# Patient Record
Sex: Female | Born: 1960 | Race: Black or African American | Hispanic: No | Marital: Single | Smoking: Never smoker
Health system: Southern US, Community
[De-identification: ages and names within clinical notes are randomized; demographics above are authoritative.]

## PROBLEM LIST (undated history)

## (undated) DIAGNOSIS — I10 Essential (primary) hypertension: Secondary | ICD-10-CM

## (undated) DIAGNOSIS — I639 Cerebral infarction, unspecified: Secondary | ICD-10-CM

## (undated) DIAGNOSIS — E119 Type 2 diabetes mellitus without complications: Secondary | ICD-10-CM

## (undated) DIAGNOSIS — G9340 Encephalopathy, unspecified: Secondary | ICD-10-CM

## (undated) DIAGNOSIS — R569 Unspecified convulsions: Secondary | ICD-10-CM

## (undated) HISTORY — DX: Essential (primary) hypertension: I10

## (undated) HISTORY — DX: Unspecified convulsions: R56.9

## (undated) HISTORY — DX: Cerebral infarction, unspecified: I63.9

---

## 2013-07-19 ENCOUNTER — Encounter: Payer: Self-pay | Admitting: Emergency Medicine

## 2013-07-19 ENCOUNTER — Ambulatory Visit (INDEPENDENT_AMBULATORY_CARE_PROVIDER_SITE_OTHER): Payer: Federal, State, Local not specified - PPO | Admitting: Emergency Medicine

## 2013-07-19 VITALS — BP 210/122 | HR 90 | Temp 98.7°F | Resp 16 | Ht 63.0 in | Wt 143.0 lb

## 2013-07-19 DIAGNOSIS — S335XXA Sprain of ligaments of lumbar spine, initial encounter: Secondary | ICD-10-CM

## 2013-07-19 MED ORDER — TRAMADOL-ACETAMINOPHEN 37.5-325 MG PO TABS: 1.0000 | ORAL_TABLET | Freq: Four times a day (QID) | ORAL | Status: DC | PRN

## 2013-07-19 MED ORDER — CYCLOBENZAPRINE HCL 10 MG PO TABS: 10.0000 mg | ORAL_TABLET | Freq: Three times a day (TID) | ORAL | Status: DC | PRN

## 2013-07-19 MED ORDER — NAPROXEN SODIUM 550 MG PO TABS: 550.0000 mg | ORAL_TABLET | Freq: Two times a day (BID) | ORAL | Status: AC

## 2013-07-19 NOTE — Progress Notes (Signed)
[facility] [address], [place] [address] [phone]  Date:  07/19/2013   Name:  Rubena Roseman   DOB:  Dec 12, 1960   MRN:  [id number]  PCP:  No primary provider on file.    Chief Complaint: Back Pain   History of Present Illness:  Jaeliana Lococo is a 52 y.o. very pleasant female patient who presents with the following:  Moving from here to [facility] and packing.  Has pain in the low back.  Non radiating and no neuro symptoms.  No history of injury.  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  Attributes pain in back to lifting and bending.  No improvement with over the counter medications or other home remedies. Denies other complaint or health concern today.   There are no active problems to display for this patient.   Past Medical History  Diagnosis Date  . Hypertension     No past surgical history on file.  History  Substance Use Topics  . Smoking status: Never Smoker   . Smokeless tobacco: Not on file  . Alcohol Use: No    Family History  Problem Relation Age of Onset  . Cancer Mother   . Hypertension Mother   . Hypertension Father   . Hypertension Brother   . Hypertension Maternal Grandmother   . Stroke Maternal Grandfather     No Known Allergies  Medication list has been reviewed and updated.  No current outpatient prescriptions on file prior to visit.   No current facility-administered medications on file prior to visit.    Review of Systems:  As per HPI, otherwise negative.    Physical Examination: Filed Vitals:   07/19/13 2047  BP: 210/122  Pulse: 90  Temp: 98.7 F (37.1 C)  Resp: 16   Filed Vitals:   07/19/13 2047  Height: 5\' 3"  (1.6 m)  Weight: 143 lb (64.864 kg)   Body mass index is 25.34 kg/(m^2). Ideal Body Weight: Weight in (lb) to have BMI = 25: 140.8   GEN: WDWN, NAD, Non-toxic, Alert & Oriented x 3 HEENT: Atraumatic, Normocephalic.  Ears and Nose: No external deformity. EXTR: No  clubbing/cyanosis/edema NEURO: Normal gait.  PSYCH: Normally interactive. Conversant. Not depressed or anxious appearing.  Calm demeanor.  BACK:  Tender lumbar paraspinous muscles.  Assessment and Plan: Lumbosacral strain Anaprox Flexeril Ultram   Signed,  Phillips Odor, MD

## 2013-07-19 NOTE — Patient Instructions (Signed)

## 2015-07-10 ENCOUNTER — Emergency Department (HOSPITAL_COMMUNITY): Payer: Federal, State, Local not specified - PPO

## 2015-07-10 ENCOUNTER — Inpatient Hospital Stay (HOSPITAL_COMMUNITY)
Admission: EM | Admit: 2015-07-10 | Discharge: 2015-07-13 | DRG: 064 | Disposition: A | Discharge status: Home Health | Payer: Federal, State, Local not specified - PPO | Attending: Internal Medicine | Admitting: Internal Medicine

## 2015-07-10 ENCOUNTER — Encounter (HOSPITAL_COMMUNITY): Payer: Self-pay | Admitting: Emergency Medicine

## 2015-07-10 DIAGNOSIS — I160 Hypertensive urgency: Secondary | ICD-10-CM | POA: Diagnosis present

## 2015-07-10 DIAGNOSIS — Z823 Family history of stroke: Secondary | ICD-10-CM

## 2015-07-10 DIAGNOSIS — Z9114 Patient's other noncompliance with medication regimen: Secondary | ICD-10-CM | POA: Diagnosis not present

## 2015-07-10 DIAGNOSIS — G9340 Encephalopathy, unspecified: Secondary | ICD-10-CM

## 2015-07-10 DIAGNOSIS — D72829 Elevated white blood cell count, unspecified: Secondary | ICD-10-CM | POA: Diagnosis present

## 2015-07-10 DIAGNOSIS — J9622 Acute and chronic respiratory failure with hypercapnia: Secondary | ICD-10-CM | POA: Diagnosis present

## 2015-07-10 DIAGNOSIS — E1101 Type 2 diabetes mellitus with hyperosmolarity with coma: Secondary | ICD-10-CM | POA: Insufficient documentation

## 2015-07-10 DIAGNOSIS — Z8249 Family history of ischemic heart disease and other diseases of the circulatory system: Secondary | ICD-10-CM | POA: Diagnosis not present

## 2015-07-10 DIAGNOSIS — H5500 Unspecified nystagmus: Secondary | ICD-10-CM | POA: Diagnosis present

## 2015-07-10 DIAGNOSIS — R531 Weakness: Secondary | ICD-10-CM | POA: Diagnosis present

## 2015-07-10 DIAGNOSIS — N179 Acute kidney failure, unspecified: Secondary | ICD-10-CM | POA: Diagnosis present

## 2015-07-10 DIAGNOSIS — I6789 Other cerebrovascular disease: Secondary | ICD-10-CM | POA: Diagnosis not present

## 2015-07-10 DIAGNOSIS — R569 Unspecified convulsions: Secondary | ICD-10-CM | POA: Diagnosis not present

## 2015-07-10 DIAGNOSIS — E785 Hyperlipidemia, unspecified: Secondary | ICD-10-CM | POA: Diagnosis present

## 2015-07-10 DIAGNOSIS — Z794 Long term (current) use of insulin: Secondary | ICD-10-CM | POA: Diagnosis not present

## 2015-07-10 DIAGNOSIS — E871 Hypo-osmolality and hyponatremia: Secondary | ICD-10-CM | POA: Diagnosis present

## 2015-07-10 DIAGNOSIS — I161 Hypertensive emergency: Secondary | ICD-10-CM | POA: Insufficient documentation

## 2015-07-10 DIAGNOSIS — Z79899 Other long term (current) drug therapy: Secondary | ICD-10-CM | POA: Diagnosis not present

## 2015-07-10 DIAGNOSIS — R410 Disorientation, unspecified: Secondary | ICD-10-CM | POA: Insufficient documentation

## 2015-07-10 DIAGNOSIS — G9341 Metabolic encephalopathy: Secondary | ICD-10-CM | POA: Diagnosis present

## 2015-07-10 DIAGNOSIS — E876 Hypokalemia: Secondary | ICD-10-CM | POA: Diagnosis present

## 2015-07-10 DIAGNOSIS — I639 Cerebral infarction, unspecified: Principal | ICD-10-CM | POA: Diagnosis present

## 2015-07-10 DIAGNOSIS — E1165 Type 2 diabetes mellitus with hyperglycemia: Secondary | ICD-10-CM | POA: Diagnosis present

## 2015-07-10 DIAGNOSIS — R739 Hyperglycemia, unspecified: Secondary | ICD-10-CM | POA: Diagnosis present

## 2015-07-10 DIAGNOSIS — R29810 Facial weakness: Secondary | ICD-10-CM | POA: Diagnosis present

## 2015-07-10 DIAGNOSIS — G459 Transient cerebral ischemic attack, unspecified: Secondary | ICD-10-CM | POA: Insufficient documentation

## 2015-07-10 DIAGNOSIS — D649 Anemia, unspecified: Secondary | ICD-10-CM | POA: Diagnosis present

## 2015-07-10 DIAGNOSIS — E870 Hyperosmolality and hypernatremia: Secondary | ICD-10-CM | POA: Diagnosis present

## 2015-07-10 DIAGNOSIS — I10 Essential (primary) hypertension: Secondary | ICD-10-CM | POA: Insufficient documentation

## 2015-07-10 DIAGNOSIS — E860 Dehydration: Secondary | ICD-10-CM | POA: Diagnosis present

## 2015-07-10 DIAGNOSIS — E872 Acidosis, unspecified: Secondary | ICD-10-CM | POA: Insufficient documentation

## 2015-07-10 HISTORY — DX: Type 2 diabetes mellitus without complications: E11.9

## 2015-07-10 HISTORY — DX: Encephalopathy, unspecified: G93.40

## 2015-07-10 LAB — CBC
HCT: 37 % (ref 36.0–46.0)
HCT: 35.2 % — ABNORMAL LOW (ref 36.0–46.0)
HEMOGLOBIN: 12.3 g/dL (ref 12.0–15.0)
HEMOGLOBIN: 11.5 g/dL — AB (ref 12.0–15.0)
MCH: 29.2 pg (ref 26.0–34.0)
MCH: 28.8 pg (ref 26.0–34.0)
MCHC: 33.2 g/dL (ref 30.0–36.0)
MCHC: 32.7 g/dL (ref 30.0–36.0)
MCV: 87.9 fL (ref 78.0–100.0)
MCV: 88.2 fL (ref 78.0–100.0)
Platelets: 287 10*3/uL (ref 150–400)
Platelets: 264 10*3/uL (ref 150–400)
RBC: 4.21 MIL/uL (ref 3.87–5.11)
RBC: 3.99 MIL/uL (ref 3.87–5.11)
RDW: 13.4 % (ref 11.5–15.5)
RDW: 13.2 % (ref 11.5–15.5)
WBC: 10.8 10*3/uL — ABNORMAL HIGH (ref 4.0–10.5)
WBC: 14.1 10*3/uL — ABNORMAL HIGH (ref 4.0–10.5)

## 2015-07-10 LAB — BLOOD GAS, ARTERIAL
ACID-BASE EXCESS: 1.5 mmol/L (ref 0.0–2.0)
Acid-Base Excess: 0.2 mmol/L (ref 0.0–2.0)
BICARBONATE: 27.2 meq/L — AB (ref 20.0–24.0)
Bicarbonate: 26.3 mEq/L — ABNORMAL HIGH (ref 20.0–24.0)
DRAWN BY: 252031
O2 Content: 3 L/min
O2 SAT: 95.7 %
O2 Saturation: 98.8 %
PATIENT TEMPERATURE: 98.6
PCO2 ART: 69.9 mmHg — AB (ref 35.0–45.0)
PH ART: 7.215 — AB (ref 7.350–7.450)
PH ART: 7.359 (ref 7.350–7.450)
Patient temperature: 98.6
TCO2: 29.3 mmol/L (ref 0–100)
TCO2: 27.8 mmol/L (ref 0–100)
pCO2 arterial: 47.9 mmHg — ABNORMAL HIGH (ref 35.0–45.0)
pO2, Arterial: 184 mmHg — ABNORMAL HIGH (ref 80.0–100.0)
pO2, Arterial: 80.2 mmHg (ref 80.0–100.0)

## 2015-07-10 LAB — ETHANOL: Alcohol, Ethyl (B): <5 mg/dL (ref <5)

## 2015-07-10 LAB — COMPREHENSIVE METABOLIC PANEL
ALBUMIN: 3.8 g/dL (ref 3.5–5.0)
ALT: 11 U/L — ABNORMAL LOW (ref 14–54)
ANION GAP: 13 (ref 5–15)
AST: 22 U/L (ref 15–41)
Alkaline Phosphatase: 97 U/L (ref 38–126)
BILIRUBIN TOTAL: 0.9 mg/dL (ref 0.3–1.2)
BUN: 14 mg/dL (ref 6–20)
CHLORIDE: 95 mmol/L — AB (ref 101–111)
CO2: 24 mmol/L (ref 22–32)
Calcium: 9 mg/dL (ref 8.9–10.3)
Creatinine, Ser: 1.41 mg/dL — ABNORMAL HIGH (ref 0.44–1.00)
GFR calc Af Amer: 48 mL/min — ABNORMAL LOW (ref >60)
GFR calc non Af Amer: 41 mL/min — ABNORMAL LOW (ref >60)
GLUCOSE: 652 mg/dL — AB (ref 65–99)
POTASSIUM: 4.4 mmol/L (ref 3.5–5.1)
SODIUM: 132 mmol/L — AB (ref 135–145)
Total Protein: 7.8 g/dL (ref 6.5–8.1)

## 2015-07-10 LAB — URINE MICROSCOPIC-ADD ON

## 2015-07-10 LAB — I-STAT ARTERIAL BLOOD GAS, ED
Acid-base deficit: 2 mmol/L (ref 0.0–2.0)
Bicarbonate: 27.6 meq/L — ABNORMAL HIGH (ref 20.0–24.0)
O2 Saturation: 78 %
TCO2: 30 mmol/L (ref 0–100)
pCO2 arterial: 69.2 mmHg (ref 35.0–45.0)
pH, Arterial: 7.209 — ABNORMAL LOW (ref 7.350–7.450)
pO2, Arterial: 53 mmHg — ABNORMAL LOW (ref 80.0–100.0)

## 2015-07-10 LAB — I-STAT VENOUS BLOOD GAS, ED
ACID-BASE EXCESS: 2 mmol/L (ref 0.0–2.0)
Bicarbonate: 31.6 mEq/L — ABNORMAL HIGH (ref 20.0–24.0)
O2 SAT: 33 %
PCO2 VEN: 71.1 mmHg — AB (ref 45.0–50.0)
PO2 VEN: 24 mmHg — AB (ref 30.0–45.0)
TCO2: 34 mmol/L (ref 0–100)
pH, Ven: 7.256 (ref 7.250–7.300)

## 2015-07-10 LAB — URINALYSIS, ROUTINE W REFLEX MICROSCOPIC
Bilirubin Urine: NEGATIVE
KETONES UR: NEGATIVE mg/dL
Nitrite: NEGATIVE
PH: 7 (ref 5.0–8.0)
PROTEIN: NEGATIVE mg/dL
Specific Gravity, Urine: 1.023 (ref 1.005–1.030)

## 2015-07-10 LAB — MRSA PCR SCREENING: MRSA BY PCR: NEGATIVE

## 2015-07-10 LAB — I-STAT CHEM 8, ED
BUN: 19 mg/dL (ref 6–20)
CALCIUM ION: 1.11 mmol/L — AB (ref 1.12–1.23)
CREATININE: 1.2 mg/dL — AB (ref 0.44–1.00)
Chloride: 94 mmol/L — ABNORMAL LOW (ref 101–111)
Glucose, Bld: 612 mg/dL (ref 65–99)
HCT: 40 % (ref 36.0–46.0)
Hemoglobin: 13.6 g/dL (ref 12.0–15.0)
Potassium: 4 mmol/L (ref 3.5–5.1)
Sodium: 133 mmol/L — ABNORMAL LOW (ref 135–145)
TCO2: 31 mmol/L (ref 0–100)

## 2015-07-10 LAB — GLUCOSE, CAPILLARY
GLUCOSE-CAPILLARY: 322 mg/dL — AB (ref 65–99)
GLUCOSE-CAPILLARY: 316 mg/dL — AB (ref 65–99)
Glucose-Capillary: 403 mg/dL — ABNORMAL HIGH (ref 65–99)
Glucose-Capillary: 228 mg/dL — ABNORMAL HIGH (ref 65–99)

## 2015-07-10 LAB — APTT: aPTT: 29 seconds (ref 24–37)

## 2015-07-10 LAB — PROTIME-INR
INR: 1.01 (ref 0.00–1.49)
Prothrombin Time: 13.5 seconds (ref 11.6–15.2)

## 2015-07-10 LAB — BASIC METABOLIC PANEL
ANION GAP: 11 (ref 5–15)
BUN: 11 mg/dL (ref 6–20)
CALCIUM: 8.7 mg/dL — AB (ref 8.9–10.3)
CO2: 26 mmol/L (ref 22–32)
Chloride: 102 mmol/L (ref 101–111)
Creatinine, Ser: 0.89 mg/dL (ref 0.44–1.00)
GFR calc Af Amer: >60 mL/min (ref >60)
GLUCOSE: 343 mg/dL — AB (ref 65–99)
Potassium: 3.6 mmol/L (ref 3.5–5.1)
Sodium: 139 mmol/L (ref 135–145)

## 2015-07-10 LAB — DIFFERENTIAL
Basophils Absolute: 0 10*3/uL (ref 0.0–0.1)
Basophils Relative: 0 %
EOS PCT: 1 %
Eosinophils Absolute: 0.1 10*3/uL (ref 0.0–0.7)
LYMPHS ABS: 1.6 10*3/uL (ref 0.7–4.0)
LYMPHS PCT: 15 %
Monocytes Absolute: 0.6 10*3/uL (ref 0.1–1.0)
Monocytes Relative: 6 %
NEUTROS PCT: 78 %
Neutro Abs: 8.4 10*3/uL — ABNORMAL HIGH (ref 1.7–7.7)

## 2015-07-10 LAB — POCT I-STAT 3, ART BLOOD GAS (G3+)
Acid-base deficit: 1 mmol/L (ref 0.0–2.0)
BICARBONATE: 28 meq/L — AB (ref 20.0–24.0)
O2 Saturation: 86 %
PH ART: 7.242 — AB (ref 7.350–7.450)
TCO2: 30 mmol/L (ref 0–100)
pCO2 arterial: 65 mmHg (ref 35.0–45.0)
pO2, Arterial: 62 mmHg — ABNORMAL LOW (ref 80.0–100.0)

## 2015-07-10 LAB — LIPID PANEL
CHOL/HDL RATIO: 3.7 ratio
Cholesterol: 323 mg/dL — ABNORMAL HIGH (ref 0–200)
HDL: 88 mg/dL (ref >40)
LDL Cholesterol: 203 mg/dL — ABNORMAL HIGH (ref 0–99)
Triglycerides: 162 mg/dL — ABNORMAL HIGH (ref <150)
VLDL: 32 mg/dL (ref 0–40)

## 2015-07-10 LAB — LACTIC ACID, PLASMA
LACTIC ACID, VENOUS: 1.9 mmol/L (ref 0.5–2.0)
LACTIC ACID, VENOUS: 2.3 mmol/L — AB (ref 0.5–2.0)

## 2015-07-10 LAB — RAPID URINE DRUG SCREEN, HOSP PERFORMED
AMPHETAMINES: NOT DETECTED
BARBITURATES: NOT DETECTED
BENZODIAZEPINES: NOT DETECTED
COCAINE: NOT DETECTED
OPIATES: NOT DETECTED
TETRAHYDROCANNABINOL: NOT DETECTED

## 2015-07-10 LAB — I-STAT TROPONIN, ED: Troponin i, poc: 0 ng/mL (ref 0.00–0.08)

## 2015-07-10 LAB — CBG MONITORING, ED: Glucose-Capillary: >600 mg/dL (ref 65–99)

## 2015-07-10 LAB — TROPONIN I: TROPONIN I: 0.04 ng/mL — AB (ref <0.031)

## 2015-07-10 LAB — AMMONIA: Ammonia: 40 umol/L — ABNORMAL HIGH (ref 9–35)

## 2015-07-10 MED ORDER — SODIUM CHLORIDE 0.9 % IV SOLN
INTRAVENOUS | Status: AC
  Administered 2015-07-10: 1000 mL via INTRAVENOUS

## 2015-07-10 MED ORDER — SODIUM CHLORIDE 0.9 % IV SOLN
INTRAVENOUS | Status: DC
  Administered 2015-07-10: 3.4 [IU]/h via INTRAVENOUS
  Filled 2015-07-10: qty 2.5

## 2015-07-10 MED ORDER — SODIUM CHLORIDE 0.9 % IV BOLUS (SEPSIS)
1000.0000 mL | Freq: Once | INTRAVENOUS | Status: AC
  Administered 2015-07-10: 1000 mL via INTRAVENOUS

## 2015-07-10 MED ORDER — HYDRALAZINE HCL 20 MG/ML IJ SOLN
5.0000 mg | INTRAMUSCULAR | Status: DC | PRN
  Filled 2015-07-10: qty 1

## 2015-07-10 MED ORDER — DEXTROSE-NACL 5-0.45 % IV SOLN
INTRAVENOUS | Status: DC
  Administered 2015-07-11: 04:00:00 via INTRAVENOUS

## 2015-07-10 MED ORDER — HYDRALAZINE HCL 20 MG/ML IJ SOLN
10.0000 mg | INTRAMUSCULAR | Status: DC | PRN
  Administered 2015-07-12 – 2015-07-13 (×3): 20 mg via INTRAVENOUS
  Filled 2015-07-10 (×3): qty 1

## 2015-07-10 MED ORDER — HYDRALAZINE HCL 20 MG/ML IJ SOLN: 10.0000 mg | INTRAMUSCULAR | Status: DC | PRN

## 2015-07-10 MED ORDER — VANCOMYCIN HCL 500 MG IV SOLR
500.0000 mg | Freq: Two times a day (BID) | INTRAVENOUS | Status: DC
  Administered 2015-07-11 – 2015-07-12 (×3): 500 mg via INTRAVENOUS
  Filled 2015-07-10 (×4): qty 500

## 2015-07-10 MED ORDER — HYDRALAZINE HCL 20 MG/ML IJ SOLN: 10.0000 mg | Freq: Once | INTRAMUSCULAR | Status: DC

## 2015-07-10 MED ORDER — PIPERACILLIN-TAZOBACTAM 3.375 G IVPB
3.3750 g | Freq: Three times a day (TID) | INTRAVENOUS | Status: DC
  Administered 2015-07-11 – 2015-07-12 (×4): 3.375 g via INTRAVENOUS
  Filled 2015-07-10 (×6): qty 50

## 2015-07-10 MED ORDER — HYDRALAZINE HCL 20 MG/ML IJ SOLN: 5.0000 mg | INTRAMUSCULAR | Status: DC | PRN

## 2015-07-10 MED ORDER — LABETALOL HCL 5 MG/ML IV SOLN
10.0000 mg | Freq: Once | INTRAVENOUS | Status: AC
  Administered 2015-07-10: 10 mg via INTRAVENOUS
  Filled 2015-07-10: qty 4

## 2015-07-10 MED ORDER — PIPERACILLIN-TAZOBACTAM 3.375 G IVPB 30 MIN
3.3750 g | Freq: Once | INTRAVENOUS | Status: AC
  Administered 2015-07-10: 3.375 g via INTRAVENOUS
  Filled 2015-07-10: qty 50

## 2015-07-10 MED ORDER — STROKE: EARLY STAGES OF RECOVERY BOOK
Freq: Once | Status: AC
  Administered 2015-07-10: 22:00:00
  Filled 2015-07-10: qty 1

## 2015-07-10 MED ORDER — SENNOSIDES-DOCUSATE SODIUM 8.6-50 MG PO TABS: 1.0000 | ORAL_TABLET | Freq: Every evening | ORAL | Status: DC | PRN

## 2015-07-10 MED ORDER — VANCOMYCIN HCL IN DEXTROSE 1-5 GM/200ML-% IV SOLN
1000.0000 mg | Freq: Once | INTRAVENOUS | Status: AC
  Administered 2015-07-10: 1000 mg via INTRAVENOUS
  Filled 2015-07-10: qty 200

## 2015-07-10 MED ORDER — HYDRALAZINE HCL 20 MG/ML IJ SOLN
5.0000 mg | Freq: Once | INTRAMUSCULAR | Status: DC
  Administered 2015-07-10: 5 mg via INTRAVENOUS

## 2015-07-10 MED ORDER — ONDANSETRON HCL 4 MG/2ML IJ SOLN: 4.0000 mg | Freq: Four times a day (QID) | INTRAMUSCULAR | Status: DC | PRN

## 2015-07-10 MED ORDER — ENOXAPARIN SODIUM 30 MG/0.3ML ~~LOC~~ SOLN
30.0000 mg | SUBCUTANEOUS | Status: DC
  Administered 2015-07-10: 30 mg via SUBCUTANEOUS
  Filled 2015-07-10: qty 0.3

## 2015-07-10 NOTE — Progress Notes (Signed)
CRITICAL VALUE ALERT  Critical value received:  Lactic acid 2.3   Date of notification:  07/10/15  Time of notification:  2200   Critical value read back:Yes.    Nurse who received alert:  Santiago BurElizabeth Lyzbeth Genrich, RN  MD notified (1st page):  Maren ReamerKaren Kirby, NP  Time of first page:  2225  MD notified (2nd page):  Time of second page:  Responding MD:  Maren ReamerKaren Kirby, NP  Time MD responded:  2225

## 2015-07-10 NOTE — ED Notes (Signed)
Emergency Contact notified of pts admission to the hospital. States she is on her way. Directions given.

## 2015-07-10 NOTE — ED Provider Notes (Addendum)
CSN: [id number]     Arrival date & time 07/10/15  1419 History   First MD Initiated Contact with Patient 07/10/15 1419     Chief Complaint  Patient presents with  . Altered Mental Status     (Consider location/radiation/quality/duration/timing/severity/associated sxs/prior Treatment) HPI  54 year old female presents brought in by EMS with altered mental status. History is taken from EMS as the patient is currently confused. Per them, [facility] because patient was not acting right and sitting [facility] the curb at [employer]. Apparently they have found her car and there is a TV box next to it. It is uncertain [facility] or how she got there and if she drove. Patient is answering nonsensically and cannot provide any information. It is unknown how long she has been altered. Patient is not moving the left side of her body per EMS and seems to have a left-sided facial droop. She has been hypertensive, over 200/100. He also noted her glucose to be over 600. Patient knows her name but does not answer other questions very well.  Past Medical History  Diagnosis Date  . Hypertension   . Diabetes mellitus without complication (HCC)    History reviewed. No pertinent past surgical history. Family History  Problem Relation Age of Onset  . Cancer Mother   . Hypertension Mother   . Hypertension Father   . Hypertension Brother   . Hypertension Maternal Grandmother   . Stroke Maternal Grandfather    Social History  Substance Use Topics  . Smoking status: Never Smoker   . Smokeless tobacco: None  . Alcohol Use: No   OB History    No data available     Review of Systems  Unable to perform ROS: Mental status change      Allergies  Review of patient's allergies indicates no known allergies.  Home Medications   Prior to Admission medications   Medication Sig Start Date End Date Taking? Authorizing Provider  amLODipine (NORVASC) 5 MG tablet Take 5 mg by  mouth daily.   Yes Historical Provider, MD  insulin detemir (LEVEMIR) 100 UNIT/ML injection Inject 10 Units into the skin every evening. 06/23/15 06/22/16 Yes Historical Provider, MD  metFORMIN (GLUCOPHAGE) 500 MG tablet Take 500 mg by mouth 2 (two) times daily.   Yes Historical Provider, MD  metoprolol tartrate (LOPRESSOR) 25 MG tablet Take 6.25 mg by mouth 2 (two) times daily. 06/23/15  Yes Historical Provider, MD   BP 214/121 mmHg  Pulse 109  Temp(Src) 98 F (36.7 C) (Oral)  Resp 20  Ht 5\' 1"  (1.549 m)  SpO2 98% Physical Exam  Constitutional: She appears well-developed and well-nourished.  HENT:  Head: Normocephalic and atraumatic.  Right Ear: External ear normal.  Left Ear: External ear normal.  Nose: Nose normal.  Eyes: Right eye exhibits no discharge. Left eye exhibits no discharge. Right eye exhibits nystagmus. Left eye exhibits nystagmus.  Eyes deviated to left. Able to move to right but has a left sided gaze preference  Cardiovascular: Normal rate, regular rhythm and normal heart sounds.   Pulmonary/Chest: Effort normal and breath sounds normal.  Abdominal: Soft. There is no tenderness.  Neurological: She is alert. She is disoriented. GCS eye subscore is 4. GCS verbal subscore is 4. GCS motor subscore is 6.  Mild left-sided facial droop. Left arm and left leg are flaccid. Right leg appears weak but can be moved spontaneously. Right upper extremity has normal tone and response to  pain and stimulation. Patient is awake and alert to herself and knows that she is in [place]. She is confused on where exactly she is as well as time.  Skin: Skin is warm and dry.  Nursing note and vitals reviewed.   ED Course  Procedures (including critical care time) Labs Review Labs Reviewed  CBC - Abnormal; Notable for the following:    WBC 10.8 (*)    All other components within normal limits  DIFFERENTIAL - Abnormal; Notable for the following:    Neutro Abs 8.4 (*)    All other components  within normal limits  COMPREHENSIVE METABOLIC PANEL - Abnormal; Notable for the following:    Sodium 132 (*)    Chloride 95 (*)    Glucose, Bld 652 (*)    Creatinine, Ser 1.41 (*)    ALT 11 (*)    GFR calc non Af Amer 41 (*)    GFR calc Af Amer 48 (*)    All other components within normal limits  CBG MONITORING, ED - Abnormal; Notable for the following:    Glucose-Capillary >600 (*)    All other components within normal limits  I-STAT CHEM 8, ED - Abnormal; Notable for the following:    Sodium 133 (*)    Chloride 94 (*)    Creatinine, Ser 1.20 (*)    Glucose, Bld 612 (*)    Calcium, Ion 1.11 (*)    All other components within normal limits  I-STAT VENOUS BLOOD GAS, ED - Abnormal; Notable for the following:    pCO2, Ven 71.1 (*)    pO2, Ven 24.0 (*)    Bicarbonate 31.6 (*)    All other components within normal limits  ETHANOL  PROTIME-INR  APTT  URINE RAPID DRUG SCREEN, HOSP PERFORMED  URINALYSIS, ROUTINE W REFLEX MICROSCOPIC (NOT AT [facility])  I-STAT TROPOININ, ED    Imaging Review Ct Head Wo Contrast  07/10/2015  CLINICAL DATA:  Found unresponsive at [employer], complains of frontal headache EXAM: CT HEAD WITHOUT CONTRAST TECHNIQUE: Contiguous axial images were obtained from the base of the skull through the vertex without intravenous contrast. COMPARISON:  None. FINDINGS: Mild diffuse cortical atrophy. Moderately prominent low attenuation throughout the periventricular white matter somewhat advanced for patient age. Mild age-related left basal ganglia calcification. No evidence of large vascular territory infarct. No hemorrhage or extra-axial fluid. No hydrocephalus. Calvarium is intact. No significant inflammatory change in the visualized portions of paranasal sinuses or mastoid air cells. IMPRESSION: Somewhat advanced involutional change for age but otherwise negative for acute abnormality. Electronically Signed   By: Esperanza Heir M.D.   On: 07/10/2015 14:58   Mr Brain Wo  Contrast (neuro Protocol)  07/10/2015  CLINICAL DATA:  54 year old female found with altered mental status at [employer]. Elevated blood pressure. Not following commands. Initial encounter. EXAM: MRI HEAD WITHOUT CONTRAST TECHNIQUE: Multiplanar, multiecho pulse sequences of the brain and surrounding structures were obtained without intravenous contrast. COMPARISON:  Head CT without contrast 1444 hours today. FINDINGS: Study is mildly degraded by motion artifact despite repeated imaging attempts. Major intracranial vascular flow voids are preserved with a degree of intracranial artery dolichoectasia. Mildly heterogeneous diffusion weighted imaging throughout the brain. Suggestion of 1 or 2 punctate foci of restricted diffusion in the right occipital lobe (series 3, image 21 and series 5, image 5) appears instead to correspond to artifact from chronic micro hemorrhages (see series 10, images 10 and 11). There also appears to be chronic micro hemorrhage in the opposite  left occipital pole (image 10). No definite restricted diffusion elsewhere. Underlying confluent bilateral cerebral white matter T2 and FLAIR hyperintensity, mostly periventricular but extending to subcortical white matter in some areas. T2 hyperintensity in the posterior limb of the right internal capsule appears chronic. There is also evidence of a chronic lacunar infarct in the left thalamus and also at the left caudothalamic groove. No cortical encephalomalacia identified. No midline shift, mass effect, evidence of mass lesion, ventriculomegaly, extra-axial collection or acute intracranial hemorrhage. Cervicomedullary junction and pituitary are within normal limits. Negative visualized cervical spine. Visible internal auditory structures appear normal. Mastoids are clear. Paranasal sinuses are clear. Orbit and scalp soft tissues appear within normal limits. Visualized bone marrow signal is within normal limits. IMPRESSION: 1.  No acute intracranial  abnormality identified. 2. Constellation of signal changes in the brain most compatible with age advanced chronic small vessel disease, including chronic micro hemorrhages in both occipital poles. Electronically Signed   By: Odessa Fleming M.D.   On: 07/10/2015 16:36   Dg Chest Portable 1 View  07/10/2015  CLINICAL DATA:  54 year old current history of hypertension and diabetes, found earlier today sitting on a curb at [employer] outside of her car for an unknown amount of time. Hyperglycemia and hypertension. Left-sided weakness on clinical examination. EXAM: PORTABLE CHEST 1 VIEW COMPARISON:  None. FINDINGS: Cardiac silhouette upper normal in size to slightly enlarged for AP portable technique. Lungs clear. Bronchovascular markings normal. Pulmonary vascularity normal. No visible pleural effusions. No pneumothorax. Degenerative changes throughout the thoracic spine. IMPRESSION: No acute cardiopulmonary disease. Electronically Signed   By: Hulan Saas M.D.   On: 07/10/2015 15:31   I have personally reviewed and evaluated these images and lab results as part of my medical decision-making.   EKG Interpretation   Date/Time:  Monday July 10 2015 14:25:47 EST Ventricular Rate:  95 PR Interval:  175 QRS Duration: 62 QT Interval:  437 QTC Calculation: 549 R Axis:   27 Text Interpretation:  Sinus rhythm Low voltage, precordial leads  Borderline T abnormalities, anterior leads Prolonged QT interval Artifact  in lead(s) II III aVR aVL aVF V1 V3 V4 V5 V6 Interpretation limited  secondary to artifact No old tracing to compare Confirmed by Dashun Borre  MD,  Akima Slaugh ([id number]) on 07/10/2015 2:38:52 PM      CRITICAL CARE Performed by: Pricilla Loveless T   Total critical care time: 45 minutes  Critical care time was exclusive of separately billable procedures and treating other patients.  Critical care was necessary to treat or prevent imminent or life-threatening deterioration.  Critical care was time  spent personally by me on the following activities: development of treatment plan with patient and/or surrogate as well as nursing, discussions with consultants, evaluation of patient's response to treatment, examination of patient, obtaining history from patient or surrogate, ordering and performing treatments and interventions, ordering and review of laboratory studies, ordering and review of radiographic studies, pulse oximetry and re-evaluation of patient's condition.  MDM   Final diagnoses:  CVA (cerebral infarction)  Hyperglycemia    Patient with acute altered mental status and strokelike symptoms. Unable to call code stroke because of unclear time of onset. Due to this CT was obtained and neuro consult was obtained. Recommends emergent MRI which was obtained it looks like a stroke to him. Initially given one dose of labetalol given severe hypertension over 220, otherwise will only treat if blood pressure goes above 220. Is having hyperglycemia is likely contribute into her metabolic encephalopathy. She  will be given fluids and started on an insulin drip. Blood gas does not correlate she does not appear to be a CO2 retainer and her bicarbonate on the blood gas and CMP are not matching. Will repeat as an ABG. Will be admitted to the stepdown unit with hospitalist. Neuro asks for CTA head/neck to be ordered as well.    Pricilla Loveless, MD 07/10/15 1640  Pricilla Loveless, MD 07/10/15 [phone]

## 2015-07-10 NOTE — Consult Note (Addendum)
Requesting Physician: Dr. Criss AlvineGoldston     Chief Complaint: L sided weakness, confusion, L gaze and nystagmus   HPI:                                                                                                                                         Kayla Donaldson is an 54 y.o. female with hx of DM,HTN who was [place] [employer], unknown time of onset presented with L sided weakness, L gaze preference , nystagmus, glucose of 600, BP of 237/105 and CO2 of 71 and Ph 7.25   Date last known well: Unable to determine Time last known well: Unable to determine tPA Given: No: unknown time of onset     Past Medical History  Diagnosis Date  . Hypertension   . Diabetes mellitus without complication (HCC)     History reviewed. No pertinent past surgical history.  Family History  Problem Relation Age of Onset  . Cancer Mother   . Hypertension Mother   . Hypertension Father   . Hypertension Brother   . Hypertension Maternal Grandmother   . Stroke Maternal Grandfather    Social History:  reports that she has never smoked. She does not have any smokeless tobacco history on file. She reports that she does not drink alcohol or use illicit drugs.  Allergies: No Known Allergies  Medications:                                                                                                                           I have reviewed the patient's current medications.  ROS:  History obtained from unobtainable from patient due to mental status  General ROS: negative for - chills, fatigue, fever, night sweats, weight gain or weight loss Psychological ROS: negative for - behavioral disorder, hallucinations, memory difficulties, mood swings or suicidal ideation Ophthalmic ROS: negative for - blurry vision, double vision, eye pain or loss of vision ENT ROS:  negative for - epistaxis, nasal discharge, oral lesions, sore throat, tinnitus or vertigo Allergy and Immunology ROS: negative for - hives or itchy/watery eyes Hematological and Lymphatic ROS: negative for - bleeding problems, bruising or swollen lymph nodes Endocrine ROS: negative for - galactorrhea, hair pattern changes, polydipsia/polyuria or temperature intolerance Respiratory ROS: negative for - cough, hemoptysis, shortness of breath or wheezing Cardiovascular ROS: negative for - chest pain, dyspnea on exertion, edema or irregular heartbeat Gastrointestinal ROS: negative for - abdominal pain, diarrhea, hematemesis, nausea/vomiting or stool incontinence Genito-Urinary ROS: negative for - dysuria, hematuria, incontinence or urinary frequency/urgency Musculoskeletal ROS: negative for - joint swelling or muscular weakness Neurological ROS: as noted in HPI Dermatological ROS: negative for rash and skin lesion changes  Neurologic Examination:                                                                                                      Blood pressure 246/149, pulse 80, temperature 98 F (36.7 C), temperature source Oral, resp. rate 15, height  (1.549 m), SpO2 99 %.  HEENT-  Normocephalic, no lesions, without obvious abnormality.  Normal external eye and conjunctiva.  Normal TM's bilaterally.  Normal auditory canals and external ears. Normal external nose, mucus membranes and septum.  Normal pharynx. Cardiovascular- regular rate and rhythm, S1, S2 normal, no murmur, click, rub or gallop, pulses palpable throughout   Lungs- chest clear, no wheezing, rales, normal symmetric air entry, Heart exam - S1, S2 normal, no murmur, no gallop, rate regular Abdomen- soft, non-tender; bowel sounds normal; no masses,  no organomegaly Extremities- no edema   Neurological Examination Mental Status: Alert, oriented to only person and place. Speech fluent but slow and does perseverate. Cranial  Nerves: II: pupils equal, round, reactive to light and accommodation III,IV, VI: ptosis not present, extra-ocular motions intact bilaterally but does have a L gaze preference but able to cross midline V,VII: possible small L facial droop? XII: midline tongue extension Motor: Right : Upper extremity   5/5    Left:     Upper extremity   0/5  Lower extremity   2/5     Lower extremity   0/5 Tone and bulk:normal tone throughout; no atrophy noted  Plantars: Right: equivocal    Left: equivocal        Lab Results: Basic Metabolic Panel:  Recent Labs Lab 07/10/15 1443  NA 133*  K 4.0  CL 94*  GLUCOSE 612*  BUN 19  CREATININE 1.20*    Liver Function Tests: No results for input(s): AST, ALT, ALKPHOS, BILITOT, PROT, ALBUMIN in the last 168 hours. No results for input(s): LIPASE, AMYLASE in the last 168 hours. No results for input(s): AMMONIA in the last 168 hours.  CBC:  Recent Labs Lab 07/10/15 1441 07/10/15 1443  WBC 10.8*  --   NEUTROABS 8.4*  --   HGB 12.3 13.6  HCT 37.0 40.0  MCV 87.9  --   PLT 287  --     Cardiac Enzymes: No results for input(s): CKTOTAL, CKMB, CKMBINDEX, TROPONINI in the last 168 hours.  Lipid Panel: No results for input(s): CHOL, TRIG, HDL, CHOLHDL, VLDL, LDLCALC in the last 168 hours.  CBG:  Recent Labs Lab 07/10/15 1419  GLUCAP >600*    Microbiology: No results found for this or any previous visit.  Coagulation Studies:  Recent Labs  07/10/15 1441  LABPROT 13.5  INR 1.01    Imaging: Ct Head Wo Contrast  07/10/2015  CLINICAL DATA:  Found unresponsive at [employer], complains of frontal headache EXAM: CT HEAD WITHOUT CONTRAST TECHNIQUE: Contiguous axial images were obtained from the base of the skull through the vertex without intravenous contrast. COMPARISON:  None. FINDINGS: Mild diffuse cortical atrophy. Moderately prominent low attenuation throughout the periventricular white matter somewhat advanced for patient age. Mild  age-related left basal ganglia calcification. No evidence of large vascular territory infarct. No hemorrhage or extra-axial fluid. No hydrocephalus. Calvarium is intact. No significant inflammatory change in the visualized portions of paranasal sinuses or mastoid air cells. IMPRESSION: Somewhat advanced involutional change for age but otherwise negative for acute abnormality. Electronically Signed   By: Esperanza Heir M.D.   On: 07/10/2015 14:58      07/10/2015, 3:10 PM   Assessment: 54 y.o. female with hx of DM,HTN who was found [place] of [employer], unknown time of onset presented with L sided weakness, L gaze preference , nystagmus, glucose of 600, BP of 237/105 and CO2 of 71 and Ph 7.25  Metabolic/drug vs Ischemic stroke vs possible seizure due to gaze preference  - MRI brain stroke protocol and possible CT perfusion and CTA depending on what MRI shows CTH done shows no acute abnormality, maybe a hyperdense basilar?  Stroke Risk Factors - diabetes mellitus and hypertension  Addendum: MRI negative for stroke. Would treat hypertension for patient to be normotensive. Overall process is most likely metabolic in nature considering hyperglycemia, hypertensive urgency and hypercapnia.    Addendum:

## 2015-07-10 NOTE — Progress Notes (Signed)
CBG 403 Started Insulin gtt using glucostabilizer program not DKA - Calculated to start gtt at 3.4 u/hr. Still has NS running at 150 cc/hr.

## 2015-07-10 NOTE — Progress Notes (Signed)
Pt received from ED per stretcher. Oriented to room CHG bath done and MRSA swab.

## 2015-07-10 NOTE — Progress Notes (Signed)
ANTIBIOTIC CONSULT NOTE - INITIAL  Pharmacy Consult for Vancomycin + Zosyn Indication: rule out sepsis  No Known Allergies  Patient Measurements: Height: 5\' 2"  (157.5 cm) Weight: 139 lb 5.3 oz (63.2 kg) IBW/kg (Calculated) : 50.1  Vital Signs: Temp: 98 F (36.7 C) (12/19 1800) Temp Source: Oral (12/19 1800) BP: 229/109 mmHg (12/19 1900) Pulse Rate: 102 (12/19 1800) Intake/Output from previous day:   Intake/Output from this shift:    Labs:  Recent Labs  07/10/15 1441 07/10/15 1443  WBC 10.8*  --   HGB 12.3 13.6  PLT 287  --   CREATININE 1.41* 1.20*   Estimated Creatinine Clearance: 46.8 mL/min (by C-G formula based on Cr of 1.2). No results for input(s): VANCOTROUGH, VANCOPEAK, VANCORANDOM, GENTTROUGH, GENTPEAK, GENTRANDOM, TOBRATROUGH, TOBRAPEAK, TOBRARND, AMIKACINPEAK, AMIKACINTROU, AMIKACIN in the last 72 hours.   Microbiology: No results found for this or any previous visit (from the past 720 hour(s)).  Medical History: Past Medical History  Diagnosis Date  . Hypertension   . Diabetes mellitus without complication (HCC)   . Acute encephalopathy     Assessment: [age] YOF who presented on 12/19 with AMS and L-sided weakness concerning for CVA. Work-up showed hyperglycemia, MRI negative for CVA, and continued acute encephalopathy. Pharmacy consulted to start Vancomycin + Zosyn for r/o sepsis. Wt: 63.2 kg, SCr 1.2, CrCl~45-50 ml/min.   No antibiotics have been given yet this admission.   Goal of Therapy:  Vancomycin trough level 15-20 mcg/ml  Plan:  1. Vancomycin 1g IV x 1 dose now followed by 500 mg IV every 12 hours 2. Zosyn 3.375g IV every 8 hours (infused over 4 hours) 3. Will continue to follow renal function, culture results, LOT, and antibiotic de-escalation plans   Georgina PillionElizabeth Andy Allende, PharmD, BCPS Clinical Pharmacist Pager: [phone] 07/10/2015 7:49 PM

## 2015-07-10 NOTE — Progress Notes (Addendum)
Shift event: RN paged because pt vomited, asking for antiemetics. Per RN, bowel sounds are positive and no tenderness on exam. Stat labs ordered. LA up to 2.3 from 1.9. CBC shows increased WBCC to 14. BMP pending.   Elevated LA-1L NS bolus ordered and r/p LA in 3 hours. Already cultured. On Vanc/Zosyn.  N/V-maybe secondary to hyperglycemia. Zofran for n/v. RN to call if this continues.  Encephalopathy-Pulmonary consult completed. See recs and orders. No bipap needed s/p ABG.  Hyperglycemia-still on insulin drip. Sugars still over 300.  Will follow. KJKG, NP [facility] Update: next LA 2.2, ordered another 500cc bolus.  KJKG, NP

## 2015-07-10 NOTE — Progress Notes (Signed)
RT called ABG's to NP taking care of pt Critical values received Pt refused Bipap had 02 on and pt taking it off pulling at lines

## 2015-07-10 NOTE — Progress Notes (Signed)
Pt refusing Bipap at this time, pulling at lines and confused/combative. RN aware. BiPap remains setup at bedside. Will continue to monitor as necessary.

## 2015-07-10 NOTE — ED Notes (Signed)
Pt here via ems.- pt was found at [employer] sittin on curb outside her car next to a TV. Unknown how long pt was exposed to cold, skin is cold to touch. Pt responds to her name being called nad will follow some commands. Pt seems to be weaker on the left side. CBG reads high. Pt also hypertensive. EDP at bedside upon arrival.

## 2015-07-10 NOTE — Progress Notes (Signed)
Between 1800 & 1910 Pt has urinated or been incontinent 6 times mod amounts of urine. 1 void was 300 cc. . When pt came up from Ed stretcher was soaked with urine. Passed onto oncoming RN need for Bladder scan.

## 2015-07-10 NOTE — Consult Note (Signed)
Name: Kayla Donaldson MRN: [id number] DOB: August 25, 1960    ADMISSION DATE:  07/10/2015 CONSULTATION DATE:  07/10/2015  REFERRING MD :  Dr. Konrad Dolores [facility]  CHIEF COMPLAINT:  AMS  BRIEF PATIENT DESCRIPTION: 54 year old female admitted 12/19 for acute encephalopathy of unclear etiology. She was profoundly hypertensive with SBP 210s and hyperglycemic with glucose 600s. She was agitated and was acidotic on ABG. PCCM consulted for further management.   SIGNIFICANT EVENTS  12/19 - admit  STUDIES:  CT head 12/19 > Somewhat advanced involutional change for age but otherwise negative for acute abnormality. MRI brain 12/19 > No acute intracranial abnormality identified. Constellation of signal changes in the brain most compatible with age advanced chronic small vessel disease, including chronic micro hemorrhages in both occipital poles.  HISTORY OF PRESENT ILLNESS:  54 year old female with PMH as below, which includes HTN and DM, both of which are poorly controlled per family and she is non-compliant with her medication regimen. She was found by a bystander while sitting on the curb at [employer]. She was reportedly "not acting right" the bystander called EMS. Upon their arrival the patient was confused and was noted to have unilateral weakness on the L and a questionable facial droop. This was also noted in the ED. CT and MRI have been negative for acute intracranial findings. Notably she was profoundly hypertensive in ED with SBP > 200 and Glucose > 600. Also acidosis on ABG. She was admitted to the hospitalists at which point PRN IV antihypertensives were initiated and she was started on insulin infusion. She was having alternating periods of lethargy and agitation. PCCM consulted for further evaluation.  PAST MEDICAL HISTORY :   has a past medical history of Hypertension; Diabetes mellitus without complication (HCC); and Acute encephalopathy.  has no past surgical history on file. Prior to Admission  medications   Medication Sig Start Date End Date Taking? Authorizing Provider  amLODipine (NORVASC) 5 MG tablet Take 5 mg by mouth daily.   Yes Historical Provider, MD  insulin detemir (LEVEMIR) 100 UNIT/ML injection Inject 10 Units into the skin every evening. 06/23/15 06/22/16 Yes Historical Provider, MD  metFORMIN (GLUCOPHAGE) 500 MG tablet Take 500 mg by mouth 2 (two) times daily.   Yes Historical Provider, MD  metoprolol tartrate (LOPRESSOR) 25 MG tablet Take 6.25 mg by mouth 2 (two) times daily. 06/23/15  Yes Historical Provider, MD   No Known Allergies  FAMILY HISTORY:  family history includes Cancer in her mother; Hypertension in her brother, father, maternal grandmother, and mother; Stroke in her maternal grandfather. SOCIAL HISTORY:  reports that she has never smoked. She does not have any smokeless tobacco history on file. She reports that she does not drink alcohol or use illicit drugs.  REVIEW OF SYSTEMS:   Unable due to delirium  SUBJECTIVE:   VITAL SIGNS: Temp:  [98 F (36.7 C)] 98 F (36.7 C) (12/19 1800) Pulse Rate:  [80-109] 106 (12/19 1955) Resp:  [13-25] 16 (12/19 1955) BP: (169-246)/(82-149) 223/109 mmHg (12/19 1955) SpO2:  [87 %-100 %] 96 % (12/19 1955) Weight:  [63.2 kg (139 lb 5.3 oz)] 63.2 kg (139 lb 5.3 oz) (12/19 1800)  PHYSICAL EXAMINATION: General:  Female of normal body habitus in NAD Neuro:  Alert, oreinted x 3, delirium. Follows commands, but unable to effectively communicate. I did not witness any agitation during my exam. HEENT:  [place]/AT, PERRL, no appreciable JVD Cardiovascular:  RRR, no MRG Lungs:  Diminished, poor effort Abdomen:  Soft,  non-tender, non-distended. Witnessed one episode of emesis.  Musculoskeletal:  No acute deformity, no edema Skin:  Grossly intact   Recent Labs Lab 07/10/15 1441 07/10/15 1443  NA 132* 133*  K 4.4 4.0  CL 95* 94*  CO2 24  --   BUN 14 19  CREATININE 1.41* 1.20*  GLUCOSE 652* 612*    Recent Labs Lab  07/10/15 1441 07/10/15 1443  HGB 12.3 13.6  HCT 37.0 40.0  WBC 10.8*  --   PLT 287  --    Ct Head Wo Contrast  07/10/2015  CLINICAL DATA:  Found unresponsive at [employer], complains of frontal headache EXAM: CT HEAD WITHOUT CONTRAST TECHNIQUE: Contiguous axial images were obtained from the base of the skull through the vertex without intravenous contrast. COMPARISON:  None. FINDINGS: Mild diffuse cortical atrophy. Moderately prominent low attenuation throughout the periventricular white matter somewhat advanced for patient age. Mild age-related left basal ganglia calcification. No evidence of large vascular territory infarct. No hemorrhage or extra-axial fluid. No hydrocephalus. Calvarium is intact. No significant inflammatory change in the visualized portions of paranasal sinuses or mastoid air cells. IMPRESSION: Somewhat advanced involutional change for age but otherwise negative for acute abnormality. Electronically Signed   By: Esperanza Heir M.D.   On: 07/10/2015 14:58   Mr Brain Wo Contrast (neuro Protocol)  07/10/2015  CLINICAL DATA:  54 year old female found with altered mental status at [employer]. Elevated blood pressure. Not following commands. Initial encounter. EXAM: MRI HEAD WITHOUT CONTRAST TECHNIQUE: Multiplanar, multiecho pulse sequences of the brain and surrounding structures were obtained without intravenous contrast. COMPARISON:  Head CT without contrast 1444 hours today. FINDINGS: Study is mildly degraded by motion artifact despite repeated imaging attempts. Major intracranial vascular flow voids are preserved with a degree of intracranial artery dolichoectasia. Mildly heterogeneous diffusion weighted imaging throughout the brain. Suggestion of 1 or 2 punctate foci of restricted diffusion in the right occipital lobe (series 3, image 21 and series 5, image 5) appears instead to correspond to artifact from chronic micro hemorrhages (see series 10, images 10 and 11). There also appears  to be chronic micro hemorrhage in the opposite left occipital pole (image 10). No definite restricted diffusion elsewhere. Underlying confluent bilateral cerebral white matter T2 and FLAIR hyperintensity, mostly periventricular but extending to subcortical white matter in some areas. T2 hyperintensity in the posterior limb of the right internal capsule appears chronic. There is also evidence of a chronic lacunar infarct in the left thalamus and also at the left caudothalamic groove. No cortical encephalomalacia identified. No midline shift, mass effect, evidence of mass lesion, ventriculomegaly, extra-axial collection or acute intracranial hemorrhage. Cervicomedullary junction and pituitary are within normal limits. Negative visualized cervical spine. Visible internal auditory structures appear normal. Mastoids are clear. Paranasal sinuses are clear. Orbit and scalp soft tissues appear within normal limits. Visualized bone marrow signal is within normal limits. IMPRESSION: 1.  No acute intracranial abnormality identified. 2. Constellation of signal changes in the brain most compatible with age advanced chronic small vessel disease, including chronic micro hemorrhages in both occipital poles. Electronically Signed   By: Odessa Fleming M.D.   On: 07/10/2015 16:36   Dg Chest Portable 1 View  07/10/2015  CLINICAL DATA:  54 year old current history of hypertension and diabetes, found earlier today sitting on a curb at [employer] outside of her car for an unknown amount of time. Hyperglycemia and hypertension. Left-sided weakness on clinical examination. EXAM: PORTABLE CHEST 1 VIEW COMPARISON:  None. FINDINGS: Cardiac silhouette upper normal  in size to slightly enlarged for AP portable technique. Lungs clear. Bronchovascular markings normal. Pulmonary vascularity normal. No visible pleural effusions. No pneumothorax. Degenerative changes throughout the thoracic spine. IMPRESSION: No acute cardiopulmonary disease.  Electronically Signed   By: Hulan Saashomas  Lawrence M.D.   On: 07/10/2015 15:31    ASSESSMENT / PLAN:  Acute encephalopathy >  Etiology not entirely clear at this point. Most likely on the differential are HHS from hyperglycemia, and hypertensive encephalopathy, although MRI not compatible with PRES. Acidosis likely contributing as well. She has what appears to be a chronic respiratory failure as evidenced by elevated bicarb on ABG. Difficult to discern whether acidosis respiratory or metabolic in this setting, however, lactic acid normal and no ketones in urine, no anion gap, so suspect this is respiratory. Currently she is drowsy with delirium, no agitation observed. UDS negative. Plan: - Re-assess ABG - If agitation is interfering with care can transfer to ICU for precedex infusion  Hyperglycemia with history of DM (suspect HHS) Plan: - continue insulin gtt per protocol - IVF hydration - no need to repeat lactic acid - Assess Hgb A1C  Hypertensive urgency Plan: - telemetry monitoring in SDU - will increase PRN dose of hydralazine - Target SBP 170-190 first 24 hours. (Presenting SBP 246) - If unable to achieve this would start nicardipine infusion and move to ICU bed  Acute on chronic hypercarbic respiratory failure - Repeat ABG (some concern from staff that prior samples appeared venous) - Low threshold to initiate NIMV, however, which agitation/decreased MS and small volume emesis (possibly even sputum) may not be the best candidate for this, if ABG not improving may need ICU for NIMV possibly intubation. - Supplemental O2 to keep SpO2 92-98% - IS as tolerated  AKI -per primary team   Joneen RoachPaul Hoffman, AGACNP-BC [facility] Pager [phone] or [phone]  07/10/2015 9:26 PM

## 2015-07-10 NOTE — Progress Notes (Signed)
Pt's BP 229/109 Given 5 mg IV Apresoline as ordered.

## 2015-07-10 NOTE — ED Notes (Signed)
Critical glucose 652 reported to Dr. Criss AlvineGoldston

## 2015-07-10 NOTE — H&P (Signed)
[facility] History and Physical  Adamae Ricklefs DVV:[id number] DOB: 1961-04-22 DOA: 07/10/2015  Referring physician: Regenia Skeeter PCP: No primary care provider on file.   Chief Complaint: ams/left sided weakness  HPI: Ryna Beckstrom is a 54 y.o. female with a past medical history that includes hypertension, diabetes presents to the emergency department if complaint of altered mental status. Initial evaluation in the emergency department reveals hyperglycemia, hyponatremia, acute kidney injury, sided weakness concerning for stroke.  Information is obtained from EMS and the chart as patient alert but not following commands or interactive. Portably patient was noted by a bystander at [facility] to be quite altered and called the police. Apparently they found her car package next to it. Is uncertain when she arrived at the [facility] or how she got there were she drove. EMS arrived and noted left-sided weakness and left-sided facial droop. In the field her blood pressure was 200/100 and her CBG was over 600.  In the emergency department she is afebrile, hypertensive and not hypoxic   Review of Systems:  Unable to obtain review of systems patient's verbal responses nonsensical.   Past Medical History  Diagnosis Date  . Hypertension   . Diabetes mellitus without complication ([place])   . Acute encephalopathy    History reviewed. No pertinent past surgical history. Social History:  reports that she has never smoked. She does not have any smokeless tobacco history on file. She reports that she does not drink alcohol or use illicit drugs.  No Known Allergies  Family History  Problem Relation Age of Onset  . Cancer Mother   . Hypertension Mother   . Hypertension Father   . Hypertension Brother   . Hypertension Maternal Grandmother   . Stroke Maternal Grandfather      Prior to Admission medications   Medication Sig Start Date End Date Taking? Authorizing Provider  amLODipine (NORVASC) 5  MG tablet Take 5 mg by mouth daily.   Yes Historical Provider, MD  insulin detemir (LEVEMIR) 100 UNIT/ML injection Inject 10 Units into the skin every evening. 06/23/15 06/22/16 Yes Historical Provider, MD  metFORMIN (GLUCOPHAGE) 500 MG tablet Take 500 mg by mouth 2 (two) times daily.   Yes Historical Provider, MD  metoprolol tartrate (LOPRESSOR) 25 MG tablet Take 6.25 mg by mouth 2 (two) times daily. 06/23/15  Yes Historical Provider, MD   Physical Exam: Filed Vitals:   07/10/15 1530 07/10/15 1630 07/10/15 1709 07/10/15 1715  BP: 169/89 213/108 191/113 206/114  Pulse: 94 103 108   Temp:      TempSrc:      Resp: 18 14 25 21   Height:      SpO2: 95% 87% 92%     Wt Readings from Last 3 Encounters:  07/19/13 64.864 kg (143 lb)    General:  Appears well-nourished and mildly restlesse Eyes: PERRL, normal lids, irises & conjunctiva eyes deviated to the left ENT: grossly normal hearing, lips & tongue mucous membranes of her mouth are dry Neck: no LAD, masses or thyromegaly Cardiovascular: RRR, no m/r/g. No LE edema.  Respiratory: CTA bilaterally, no w/r/r. Normal respiratory effort. Abdomen: soft, ntnd no guarding or rebounding positive bowel sounds Skin: no rash or induration seen on limited exam Musculoskeletal: grossly normal tone BUE/BLE Psychiatric: grossly normal mood and affect, speech fluent and appropriate Neurologic: Moves all extremities spontaneously but left side less so. Holding left arm straight up in the air but will not grip right grip 5 out of 5 unable to follow commands speech is  clear           Labs on Admission:  Basic Metabolic Panel:  Recent Labs Lab 07/10/15 1441 07/10/15 1443  NA 132* 133*  K 4.4 4.0  CL 95* 94*  CO2 24  --   GLUCOSE 652* 612*  BUN 14 19  CREATININE 1.41* 1.20*  CALCIUM 9.0  --    Liver Function Tests:  Recent Labs Lab 07/10/15 1441  AST 22  ALT 11*  ALKPHOS 97  BILITOT 0.9  PROT 7.8  ALBUMIN 3.8   No results for input(s):  LIPASE, AMYLASE in the last 168 hours. No results for input(s): AMMONIA in the last 168 hours. CBC:  Recent Labs Lab 07/10/15 1441 07/10/15 1443  WBC 10.8*  --   NEUTROABS 8.4*  --   HGB 12.3 13.6  HCT 37.0 40.0  MCV 87.9  --   PLT 287  --    Cardiac Enzymes: No results for input(s): CKTOTAL, CKMB, CKMBINDEX, TROPONINI in the last 168 hours.  BNP (last 3 results) No results for input(s): BNP in the last 8760 hours.  ProBNP (last 3 results) No results for input(s): PROBNP in the last 8760 hours.  CBG:  Recent Labs Lab 07/10/15 1419  GLUCAP >600*    Radiological Exams on Admission: Ct Head Wo Contrast  07/10/2015  CLINICAL DATA:  Found unresponsive at [place], complains of frontal headache EXAM: CT HEAD WITHOUT CONTRAST TECHNIQUE: Contiguous axial images were obtained from the base of the skull through the vertex without intravenous contrast. COMPARISON:  None. FINDINGS: Mild diffuse cortical atrophy. Moderately prominent low attenuation throughout the periventricular white matter somewhat advanced for patient age. Mild age-related left basal ganglia calcification. No evidence of large vascular territory infarct. No hemorrhage or extra-axial fluid. No hydrocephalus. Calvarium is intact. No significant inflammatory change in the visualized portions of paranasal sinuses or mastoid air cells. IMPRESSION: Somewhat advanced involutional change for age but otherwise negative for acute abnormality. Electronically Signed   By: Skipper Cliche M.D.   On: 07/10/2015 14:58   Mr Brain Wo Contrast (neuro Protocol)  07/10/2015  CLINICAL DATA:  53 year old female found with altered mental status at [place]. Elevated blood pressure. Not following commands. Initial encounter. EXAM: MRI HEAD WITHOUT CONTRAST TECHNIQUE: Multiplanar, multiecho pulse sequences of the brain and surrounding structures were obtained without intravenous contrast. COMPARISON:  Head CT without contrast 1444 hours today.  FINDINGS: Study is mildly degraded by motion artifact despite repeated imaging attempts. Major intracranial vascular flow voids are preserved with a degree of intracranial artery dolichoectasia. Mildly heterogeneous diffusion weighted imaging throughout the brain. Suggestion of 1 or 2 punctate foci of restricted diffusion in the right occipital lobe (series 3, image 21 and series 5, image 5) appears instead to correspond to artifact from chronic micro hemorrhages (see series 10, images 10 and 11). There also appears to be chronic micro hemorrhage in the opposite left occipital pole (image 10). No definite restricted diffusion elsewhere. Underlying confluent bilateral cerebral white matter T2 and FLAIR hyperintensity, mostly periventricular but extending to subcortical white matter in some areas. T2 hyperintensity in the posterior limb of the right internal capsule appears chronic. There is also evidence of a chronic lacunar infarct in the left thalamus and also at the left caudothalamic groove. No cortical encephalomalacia identified. No midline shift, mass effect, evidence of mass lesion, ventriculomegaly, extra-axial collection or acute intracranial hemorrhage. Cervicomedullary junction and pituitary are within normal limits. Negative visualized cervical spine. Visible internal auditory structures appear normal. Mastoids  are clear. Paranasal sinuses are clear. Orbit and scalp soft tissues appear within normal limits. Visualized bone marrow signal is within normal limits. IMPRESSION: 1.  No acute intracranial abnormality identified. 2. Constellation of signal changes in the brain most compatible with age advanced chronic small vessel disease, including chronic micro hemorrhages in both occipital poles. Electronically Signed   By: Genevie Ann M.D.   On: 07/10/2015 16:36   Dg Chest Portable 1 View  07/10/2015  CLINICAL DATA:  55 year old current history of hypertension and diabetes, found earlier today sitting on a  curb at [place] outside of her car for an unknown amount of time. Hyperglycemia and hypertension. Left-sided weakness on clinical examination. EXAM: PORTABLE CHEST 1 VIEW COMPARISON:  None. FINDINGS: Cardiac silhouette upper normal in size to slightly enlarged for AP portable technique. Lungs clear. Bronchovascular markings normal. Pulmonary vascularity normal. No visible pleural effusions. No pneumothorax. Degenerative changes throughout the thoracic spine. IMPRESSION: No acute cardiopulmonary disease. Electronically Signed   By: Evangeline Dakin M.D.   On: 07/10/2015 15:31    EKG: Independently reviewed. Sinus rhythm borderline T abnormalities  Assessment/Plan Principal Problem:   Acute encephalopathy Active Problems:   CVA (cerebral infarction)   Left-sided weakness   Hyperglycemia   Acute kidney injury (HCC)   Hyponatremia  1. Acute encephalopathy. Etiology uncertain. Certainly concern for CVA versus seizure versus metabolic versus. -eValuated by neurology who recommended admission for stroke workup -Recommended CTA -We'll start insulin drip -Obtain RPR folate vitamin B-12 -Addendum. MRI negative for stroke. Per neurology note treat hypertension for normotensive. He opines process likely metabolic in nature   #2. Hyperglycemia. Etiology uncertain but likely related to #1. Home medications include Levemir and metformin. -We will start insulin drip -She is not DKA -Transition to home regimen when CBGs less than 250 -Check be met 8pm  3. Hypertension. Valuated by neurology. Negative for stroke. Recommending managed to normotensive -Home medications include amlodipine and Lopressor. -We'll provide hydralazine when necessary IV -We'll start Lopressor IV with parameters in once urine drug screen comes back   4. Acute kidney injury. Suspect a chronic component however chart review reveals no history -Gentle IV fluids -Old nephrotoxins -Monitor urine output -On the emergency  department complained of needing to void but voided only small amount. Will check bladder scan rule out retention -If no improvement consider renal ultrasound  #5. Hypnatremia. Mild. -IV fluids as noted above -Recheck in the morning  neuro Code Status: full DVT Prophylaxis: Family Communication: none present Disposition Plan: home hopefully 24 -48 hours  Time spent: 70 minutes  [facility]

## 2015-07-10 NOTE — Progress Notes (Signed)
Called Dr. Konrad DoloresMerrell to make aware of ABG results.  MD feels that ABG results are venous.  No new orders given at this time, will monitor.

## 2015-07-11 ENCOUNTER — Inpatient Hospital Stay (HOSPITAL_COMMUNITY): Payer: Federal, State, Local not specified - PPO

## 2015-07-11 ENCOUNTER — Encounter (HOSPITAL_COMMUNITY): Payer: Self-pay | Admitting: General Practice

## 2015-07-11 DIAGNOSIS — I639 Cerebral infarction, unspecified: Principal | ICD-10-CM

## 2015-07-11 DIAGNOSIS — I6789 Other cerebrovascular disease: Secondary | ICD-10-CM

## 2015-07-11 DIAGNOSIS — E1101 Type 2 diabetes mellitus with hyperosmolarity with coma: Secondary | ICD-10-CM

## 2015-07-11 LAB — CBC WITH DIFFERENTIAL/PLATELET
BASOS ABS: 0 10*3/uL (ref 0.0–0.1)
Basophils Relative: 0 %
Eosinophils Absolute: 0 10*3/uL (ref 0.0–0.7)
Eosinophils Relative: 0 %
HEMATOCRIT: 31.2 % — AB (ref 36.0–46.0)
Hemoglobin: 10.2 g/dL — ABNORMAL LOW (ref 12.0–15.0)
LYMPHS ABS: 1.4 10*3/uL (ref 0.7–4.0)
LYMPHS PCT: 13 %
MCH: 29.2 pg (ref 26.0–34.0)
MCHC: 32.7 g/dL (ref 30.0–36.0)
MCV: 89.4 fL (ref 78.0–100.0)
MONO ABS: 0.7 10*3/uL (ref 0.1–1.0)
MONOS PCT: 7 %
NEUTROS ABS: 8.9 10*3/uL — AB (ref 1.7–7.7)
Neutrophils Relative %: 80 %
Platelets: 224 10*3/uL (ref 150–400)
RBC: 3.49 MIL/uL — ABNORMAL LOW (ref 3.87–5.11)
RDW: 13.7 % (ref 11.5–15.5)
WBC: 11.2 10*3/uL — ABNORMAL HIGH (ref 4.0–10.5)

## 2015-07-11 LAB — GLUCOSE, CAPILLARY
GLUCOSE-CAPILLARY: 117 mg/dL — AB (ref 65–99)
GLUCOSE-CAPILLARY: 118 mg/dL — AB (ref 65–99)
GLUCOSE-CAPILLARY: 147 mg/dL — AB (ref 65–99)
GLUCOSE-CAPILLARY: 186 mg/dL — AB (ref 65–99)
GLUCOSE-CAPILLARY: 231 mg/dL — AB (ref 65–99)
GLUCOSE-CAPILLARY: 232 mg/dL — AB (ref 65–99)
GLUCOSE-CAPILLARY: 76 mg/dL (ref 65–99)
Glucose-Capillary: 163 mg/dL — ABNORMAL HIGH (ref 65–99)
Glucose-Capillary: 100 mg/dL — ABNORMAL HIGH (ref 65–99)
Glucose-Capillary: 116 mg/dL — ABNORMAL HIGH (ref 65–99)
Glucose-Capillary: 184 mg/dL — ABNORMAL HIGH (ref 65–99)
Glucose-Capillary: 231 mg/dL — ABNORMAL HIGH (ref 65–99)

## 2015-07-11 LAB — LACTIC ACID, PLASMA: Lactic Acid, Venous: 2.2 mmol/L (ref 0.5–2.0)

## 2015-07-11 LAB — TROPONIN I
TROPONIN I: 0.06 ng/mL — AB (ref <0.031)
Troponin I: 0.05 ng/mL — ABNORMAL HIGH (ref <0.031)

## 2015-07-11 LAB — RPR: RPR: NONREACTIVE

## 2015-07-11 LAB — COMPREHENSIVE METABOLIC PANEL
ALBUMIN: 3.1 g/dL — AB (ref 3.5–5.0)
ALT: 10 U/L — ABNORMAL LOW (ref 14–54)
ANION GAP: 9 (ref 5–15)
AST: 18 U/L (ref 15–41)
Alkaline Phosphatase: 75 U/L (ref 38–126)
BUN: 9 mg/dL (ref 6–20)
CO2: 24 mmol/L (ref 22–32)
Calcium: 8.2 mg/dL — ABNORMAL LOW (ref 8.9–10.3)
Chloride: 107 mmol/L (ref 101–111)
Creatinine, Ser: 0.84 mg/dL (ref 0.44–1.00)
GFR calc Af Amer: >60 mL/min (ref >60)
GFR calc non Af Amer: >60 mL/min (ref >60)
GLUCOSE: 200 mg/dL — AB (ref 65–99)
POTASSIUM: 2.8 mmol/L — AB (ref 3.5–5.1)
SODIUM: 140 mmol/L (ref 135–145)
Total Bilirubin: 0.5 mg/dL (ref 0.3–1.2)
Total Protein: 6.6 g/dL (ref 6.5–8.1)

## 2015-07-11 LAB — PROCALCITONIN

## 2015-07-11 LAB — HIV ANTIBODY (ROUTINE TESTING W REFLEX): HIV SCREEN 4TH GENERATION: NONREACTIVE

## 2015-07-11 LAB — HEMOGLOBIN A1C
HEMOGLOBIN A1C: 14.2 % — AB (ref 4.8–5.6)
Mean Plasma Glucose: 361 mg/dL

## 2015-07-11 LAB — MAGNESIUM: Magnesium: 1.6 mg/dL — ABNORMAL LOW (ref 1.7–2.4)

## 2015-07-11 MED ORDER — ASPIRIN EC 325 MG PO TBEC
325.0000 mg | DELAYED_RELEASE_TABLET | Freq: Every day | ORAL | Status: DC
  Administered 2015-07-11 – 2015-07-13 (×3): 325 mg via ORAL
  Filled 2015-07-11 (×3): qty 1

## 2015-07-11 MED ORDER — SODIUM CHLORIDE 0.9 % IV BOLUS (SEPSIS)
500.0000 mL | Freq: Once | INTRAVENOUS | Status: AC
  Administered 2015-07-11: 500 mL via INTRAVENOUS

## 2015-07-11 MED ORDER — POTASSIUM CHLORIDE CRYS ER 20 MEQ PO TBCR
40.0000 meq | EXTENDED_RELEASE_TABLET | ORAL | Status: AC
  Administered 2015-07-11 (×2): 40 meq via ORAL
  Filled 2015-07-11 (×2): qty 2

## 2015-07-11 MED ORDER — ENOXAPARIN SODIUM 40 MG/0.4ML ~~LOC~~ SOLN
40.0000 mg | SUBCUTANEOUS | Status: DC
Start: 2015-07-11 — End: 2015-07-13
  Administered 2015-07-11 – 2015-07-12 (×2): 40 mg via SUBCUTANEOUS
  Filled 2015-07-11 (×2): qty 0.4

## 2015-07-11 MED ORDER — AMLODIPINE BESYLATE 10 MG PO TABS
10.0000 mg | ORAL_TABLET | Freq: Every day | ORAL | Status: DC
Start: 2015-07-11 — End: 2015-07-13
  Administered 2015-07-11 – 2015-07-13 (×3): 10 mg via ORAL
  Filled 2015-07-11 (×3): qty 1

## 2015-07-11 MED ORDER — INSULIN ASPART 100 UNIT/ML ~~LOC~~ SOLN
0.0000 [IU] | Freq: Three times a day (TID) | SUBCUTANEOUS | Status: DC
  Administered 2015-07-11: 5 [IU] via SUBCUTANEOUS
  Administered 2015-07-12: 2 [IU] via SUBCUTANEOUS
  Administered 2015-07-12 – 2015-07-13 (×3): 3 [IU] via SUBCUTANEOUS
  Administered 2015-07-13: 2 [IU] via SUBCUTANEOUS

## 2015-07-11 MED ORDER — LIVING WELL WITH DIABETES BOOK
Freq: Once | Status: AC
  Administered 2015-07-11: 12:00:00
  Filled 2015-07-11: qty 1

## 2015-07-11 MED ORDER — INSULIN STARTER KIT- SYRINGES (ENGLISH)
1.0000 | Freq: Once | Status: DC
Start: 2015-07-11 — End: 2015-07-13
  Filled 2015-07-11: qty 1

## 2015-07-11 MED ORDER — METOPROLOL TARTRATE 12.5 MG HALF TABLET
12.5000 mg | ORAL_TABLET | Freq: Two times a day (BID) | ORAL | Status: DC
Start: 2015-07-11 — End: 2015-07-13
  Administered 2015-07-11 – 2015-07-13 (×5): 12.5 mg via ORAL
  Filled 2015-07-11 (×5): qty 1

## 2015-07-11 MED ORDER — SODIUM CHLORIDE 0.9 % IV SOLN
500.0000 mg | Freq: Once | INTRAVENOUS | Status: AC
  Administered 2015-07-11: 500 mg via INTRAVENOUS
  Filled 2015-07-11: qty 5

## 2015-07-11 MED ORDER — ACETAMINOPHEN 325 MG PO TABS
650.0000 mg | ORAL_TABLET | Freq: Four times a day (QID) | ORAL | Status: DC | PRN
  Administered 2015-07-12 – 2015-07-13 (×2): 650 mg via ORAL
  Filled 2015-07-11 (×3): qty 2

## 2015-07-11 MED ORDER — INSULIN ASPART 100 UNIT/ML ~~LOC~~ SOLN: 2.0000 [IU] | SUBCUTANEOUS | Status: DC

## 2015-07-11 MED ORDER — INSULIN GLARGINE 100 UNIT/ML ~~LOC~~ SOLN
10.0000 [IU] | SUBCUTANEOUS | Status: DC
  Administered 2015-07-11: 10 [IU] via SUBCUTANEOUS
  Filled 2015-07-11: qty 0.1

## 2015-07-11 MED ORDER — LEVETIRACETAM ER 500 MG PO TB24
500.0000 mg | ORAL_TABLET | Freq: Every day | ORAL | Status: DC
  Administered 2015-07-12: 500 mg via ORAL
  Filled 2015-07-11 (×3): qty 1

## 2015-07-11 MED ORDER — SODIUM CHLORIDE 0.9 % IV SOLN
INTRAVENOUS | Status: AC
  Administered 2015-07-11: 10:00:00 via INTRAVENOUS

## 2015-07-11 MED ORDER — INSULIN ASPART 100 UNIT/ML ~~LOC~~ SOLN: 0.0000 [IU] | Freq: Every day | SUBCUTANEOUS | Status: DC

## 2015-07-11 MED ORDER — ATORVASTATIN CALCIUM 40 MG PO TABS
40.0000 mg | ORAL_TABLET | Freq: Every day | ORAL | Status: DC
  Administered 2015-07-11 – 2015-07-12 (×2): 40 mg via ORAL
  Filled 2015-07-11 (×2): qty 1

## 2015-07-11 MED ORDER — INSULIN DETEMIR 100 UNIT/ML ~~LOC~~ SOLN
10.0000 [IU] | Freq: Every day | SUBCUTANEOUS | Status: DC
  Administered 2015-07-12 – 2015-07-13 (×2): 10 [IU] via SUBCUTANEOUS
  Filled 2015-07-11 (×2): qty 0.1

## 2015-07-11 MED ORDER — DEXTROSE 10 % IV SOLN: INTRAVENOUS | Status: DC | PRN

## 2015-07-11 NOTE — Progress Notes (Signed)
Inpatient Diabetes Program Recommendations  AACE/ADA: New Consensus Statement on Inpatient Glycemic Control (2015)  Target Ranges:  Prepandial:   less than 140 mg/dL      Peak postprandial:   less than 180 mg/dL (1-2 hours)      Critically ill patients:  140 - 180 mg/dL   Review of Glycemic Control  Diabetes history: Type 2 Outpatient Diabetes medications: Supposed to be taking Levemir 10 units (not taking) and Metformin 500 mg BID (reportedly taking) Current orders for Inpatient glycemic control: Levemir 10 units qd, Moderate Novolog correction tid, Novolog HS correction  Inpatient Diabetes Program Recommendations:  Began diabetes assessment-- trying to build rapport with patient.  Was recently hospitalized in [place] at [facility] reportedly due to glucose and BP control issues.  Got Rx for Levemir filled.  Did not have Rx for syringes, but pharmacist gave her a few.  Never took any insulin as she states no one ever showed her how to prepare and give.  Resumed taking Metformin as she was taking prior to that adm-- but states she was not given an updated Rx.  Was also taking a OHA prior to Nov, but did not restart because she had no Rx (was supposed to be taking insulin).  Filled Rx for glucose meter.  States she has good insurance and has never had a problem getting meds in the past.  States Levemir is in her vehicle.  Has been there since she got it filled.  Has follow-up appt January 23rd with [facility].  Has not been there previously. Patient states it is convenient for her to receive care in either [place] or [place].   Patient not receptive to further diabetes education follow-up after discharge.  States the [facility] Nurse came and talked to her while in the hospital and she doesn't need any more diabetes instruction as an outpatient.  States she will take insulin-- just needs to be shown how to give it.  Explained to patient that her nurse can work with her as she  needs injections regarding prep & adm of insulin.  I will order her an insulin starter kit and diabetes instructional booklet.  Had to end session abruptly as patient had to urinate urgently.  Patient started to disconnect monitor leads and asked me to disconnect IV.  I called for assistance to front desk.  Offered to assist to bathroom if she is allowed out of bed-- states she is not.  Offered to give her bedpan, but she stated it was too late.    Recommendations: Nursing staff to begin insulin prep and adm instruction. Also needs instruction/review regarding S/S and Rx of hypo and hyperglycemia and general diabetes self-care survival skills Have asked nursing staff to encourage patient to watch Pt [employer] Channel regarding insulin administration and diabetes self-care. Will need Rx for all diabetes treatment at discharge, specific nstruction re frequency of monitoring, etc. Probably needs fresh vial of Levemir-- since has not been stored in a controlled environment.  Was just informed by nurse that patient declined to watch insulin video just now.  Diabetes Coordinator will follow-up tomorrow.  Thank you.  Chandni Gagan S. Marcelline Mates, RN, CNS, CDE Inpatient Diabetes Program, team pager [phone] (8am to 5pm)

## 2015-07-11 NOTE — Evaluation (Signed)
Occupational Therapy Evaluation Patient Details Name: Kayla LorenzoSaralyn Donaldson MRN: [id number] DOB: 22-Oct-1960 Today's Date: 07/11/2015    History of Present Illness 54 year old female admitted 12/19 for acute encephalopathy of unclear etiology. She was profoundly hypertensive with SBP 210s and hyperglycemic with glucose 600s. She was agitated and was acidotic on ABG. MRI positive for Acute right PCA territory ischemia, including involvement of the dorsal right thalamus.   Clinical Impression   This 54 yo female admitted with above presents to acute OT with decreased insight into cognitive deficits and mild decreased strength in LUE. She will benefit from acute OT with follow up HHOT and recommended 24 hour S.    Follow Up Recommendations  Home health OT;Supervision/Assistance - 24 hour    Equipment Recommendations  None recommended by OT       Precautions / Restrictions Precautions Precautions: None Restrictions Weight Bearing Restrictions: No      Mobility Bed Mobility Overal bed mobility: Modified Independent             General bed mobility comments: use of bed rail and HOB elevated   Transfers Overall transfer level: Modified independent               General transfer comment: stood without assist while I was trying to ready a chair for her, bed alarm went off    Balance Overall balance assessment: No apparent balance deficits (not formally assessed)                                          ADL Overall ADL's : Needs assistance/impaired Eating/Feeding: Independent;Sitting   Grooming: Set up;Sitting;Supervision/safety   Upper Body Bathing: Set up;Sitting;Supervision/ safety   Lower Body Bathing: Set up;Sit to/from stand;Supervison/ safety   Upper Body Dressing : Set up;Sitting;Supervision/safety   Lower Body Dressing: Set up;Sit to/from stand;Supervision/safety   Toilet Transfer: Supervision/safety;Ambulation   Toileting- Clothing  Manipulation and Hygiene: Supervision/safety;Sit to/from stand               Vision Vision Assessment?: Yes Eye Alignment: Within Functional Limits Ocular Range of Motion: Within Functional Limits Alignment/Gaze Preference: Within Defined Limits Tracking/Visual Pursuits: Able to track stimulus in all quads without difficulty Convergence: Within functional limits Visual Fields: No apparent deficits Additional Comments: Normally wears reading glasses          Pertinent Vitals/Pain Pain Assessment: No/denies pain     Hand Dominance Right   Extremity/Trunk Assessment Upper Extremity Assessment Upper Extremity Assessment: Overall WFL for tasks assessed (however did not some slowness of movement and increased edema in LUE compared to RUE)   Lower Extremity Assessment Lower Extremity Assessment: Overall WFL for tasks assessed       Communication Communication Communication: No difficulties   Cognition Arousal/Alertness: Awake/alert Behavior During Therapy: WFL for tasks assessed/performed Overall Cognitive Status: Impaired/Different from baseline Area of Impairment: Following commands       Following Commands: Follows one step commands inconsistently Safety/Judgement: Decreased awareness of safety;Decreased awareness of deficits     General Comments: Had her show me she could do finger opposition--which she did as a smooth movement across all fingers v. one at a time; when I asked her to do it again the way I was showing her (one at a time) she again did it with one smooth movement , on 3rd instruction she actually did it as I had shown her how  to. When I asked her to now do it with her eyes closed--she said that she could not do that, when I asked why she said she couldn't--I encouraged her to try at which point she opened and closed her eyes with each touch of a finger (v. keeping her eyes closed the whole time).  Then when I asked her to take her socks off and on she did  this with her eyes closed as well.              Home Living Family/patient expects to be discharged to:: Private residence Living Arrangements: Alone Available Help at Discharge: Family;Available PRN/intermittently Type of Home: House Home Access: Level entry     Home Layout: One level     Bathroom Shower/Tub: Tub/shower unit         Home Equipment: None   Additional Comments: may stay with family initially after d/c      Prior Functioning/Environment Level of Independence: Independent             OT Diagnosis: Generalized weakness;Cognitive deficits   OT Problem List: Decreased strength;Decreased cognition   OT Treatment/Interventions: Self-care/ADL training;Patient/family education;Balance training;Cognitive remediation/compensation    OT Goals(Current goals can be found in the care plan section) Acute Rehab OT Goals Patient Stated Goal: to get back to work (works for [employer]) OT Goal Formulation: With patient Time For Goal Achievement: 07/18/15 Potential to Achieve Goals: Good  OT Frequency: Min 2X/week   Barriers to D/C: Decreased caregiver support             End of Session Nurse Communication:  (physcially the pt is moving well, but cognitively she is having issues)  Activity Tolerance: Patient tolerated treatment well Patient left: in bed;with call bell/phone within reach;with bed alarm set   Time: 1420-1440 OT Time Calculation (min): 20 min Charges:  OT General Charges $OT Visit: 1 Procedure OT Evaluation $Initial OT Evaluation Tier I: 1 Procedure  Evette Georges [phone] 07/11/2015, 3:05 PM

## 2015-07-11 NOTE — Progress Notes (Addendum)
PROGRESS NOTE    Kayla Donaldson WJX:[id number] DOB: [birth year] DOA: 07/10/2015 PCP: No primary care provider on file.  HPI/Brief narrative 54 year old female patient with history of DM, HTN, noncompliant with medications, hospitalized at [facility] 06/18/15-06/23/15 for sepsis secondary to cellulitis and perivulvar abscess, poorly controlled DM & HTN, who was found down/altered mental status in front of [employer] on 12/19-unknown time of onset and presented to ED with left-sided weakness, left gaze preference, nystagmus, glucose 600, BP 237/105, CO2 71 and pH of 7.25. Neurology and CCM consulted. MRI brain negative for stroke. Neurology not working up for possible seizures. Admitted to stepdown, treated with insulin drip and CBGs improved. Mental status improved.  Assessment/Plan:  Acute encephalopathy - Initial DD: CVA (MRI Brain- positive- see below), seizures (workup ongoing), metabolic (HONK and hypertensive emergency-both improved), infectious (low index of suspicion) & respiratory acidosis - Seems to have resolved. - Driving recommendation to be made prior to discharge, based upon neurology recommendations. - RPR, HIV antibody, blood cultures 2, pending. UDS yet to be sent.  Poorly controlled type II DM with HONK - No DKA in the absence of ketones in her urine. - Treated with aggressive IV fluids and IV insulin drip. CBGs have improved in the 100-184 mg per DL range. - Patient seems to be noncompliant with medications and has several reasons. She states that when she was discharged from [facility] - she was not educated to give herself insulin's and hence has not been taking same. Also not sure if she's been taking her tablets. She was supposed to be on Levemir 10 units daily and metformin 500 MG twice a day. - A1c: 14.2 (glucose: 652) suggesting very poor control. - Started her back on Lantus/Levemir 10 units daily. SSI. Metformin on hold which can be resumed at discharge. -  Diabetes coordinator consulted for all aspects of diabetes education. - Her insulins may have to be titrated prior to discharge.  Hypertensive emergency - Initial blood pressure in the emergency department: 246/149 mmHg. - Treated with when necessary IV hydralazine and labetalol. Improved.  - Noncompliant with medications. She was supposed to be on amlodipine 10 MG daily, metoprolol 6.25 MG twice a day (could not break pill into this unusally small dose). - Start her back on amlodipine 10 MG daily and metoprolol 12.5 MG twice a day - She would benefit from being on a ACEI/ARB given her history of diabetes and may be considered either prior to discharge or as outpatient.  Acute right brain stroke - With resultant left-sided weakness-seems to have resolved. - CT head: Negative for acute abnormality - MRI brain: Initially reported as negative for acute abnormality but on review radiologist indicate acute right PCA territory ischemia, including involvement of the dorsal right thalamus. No associated mass effect or acute hemorrhage - Neurology following and stroke workup & Mx per neurology. - Not on aspirin prior to admission, antiplatelets per neurology.  ? Seizures - Evaluation & Mx per Neurology  Acute on chronic hypercapnic respiratory failure/very mild anion gap metabolic acidosis - Likely precipitated by metabolic derangement and altered mental status. - Did not require BiPAP or intubation. - CCM consultation and follow-up appreciated. - Wean off of oxygen as tolerated to keep saturations >92%. - Improved.  Acute kidney injury - Secondary to dehydration from hyperglycemia. Resolved.  Lactic acidosis - Continue hydration with IV fluids. Follow lactate and trend to normal.  Leukocytosis - Unclear etiology. May be related to acute stress. - Chest x-ray negative. Urine  microscopy not suspicious for UTI. - Empirically started on IV Zosyn and vancomycin. If all workup remains negative,  consider discontinuing antibiotics on 12/21.   Anemia - Follow CBCs.    Minimally elevated troponin - No reported chest pain. Probably stress response. Trend troponin and follow 2-D echo.   Hyperlipidemia - LDL 203. May need to repeat after glycemic control has improved. Consider statins in the context of acute stroke.   DVT prophylaxis: Lovenox  Code Status: Full Family Communication: None at bedside Disposition Plan: Continue management in stepdown unit for additional 24 hours. Possible DC home in the next 1-2 days.    Consultants:  CCM- signed off   Neurology  Procedures:  None   Antibiotics:  IV Zosyn 12/19 >   IV vancomycin 12/19 >  Subjective: Denies complaints. Denies chest pain, headache, cough or dyspnea. No fever or chills. Has no recollection of events of last night.   Objective: Filed Vitals:   07/11/15 0400 07/11/15 0500 07/11/15 0600 07/11/15 0757  BP: 184/89 150/77 178/104 157/82  Pulse: 84     Temp: 98.4 F (36.9 C)   98.7 F (37.1 C)  TempSrc: Axillary   Oral  Resp: 28 19 24 15   Height:      Weight:      SpO2:       oxygen saturation: 98%   Intake/Output Summary (Last 24 hours) at 07/11/15 1109 Last data filed at 07/11/15 0847  Gross per 24 hour  Intake 1703.33 ml  Output    500 ml  Net 1203.33 ml   Filed Weights   07/10/15 1800  Weight: 63.2 kg (139 lb 5.3 oz)     Exam:  General exam: Pleasant middle aged female patient lying comfortably supine in bed.  Respiratory system: Clear. No increased work of breathing. Cardiovascular system: S1 & S2 heard, RRR. No JVD, murmurs, gallops, clicks or pedal edema. Telemetry: SR-ST in the 100's. Gastrointestinal system: Abdomen is nondistended, soft and nontender. Normal bowel sounds heard. Central nervous system: Alert and oriented. No focal neurological deficits. Extremities: Symmetric 5 x 5 power.   Data Reviewed: Basic Metabolic Panel:  Recent Labs Lab 07/10/15 1441  07/10/15 1443 07/10/15 2130  NA 132* 133* 139  K 4.4 4.0 3.6  CL 95* 94* 102  CO2 24  --  26  GLUCOSE 652* 612* 343*  BUN 14 19 11   CREATININE 1.41* 1.20* 0.89  CALCIUM 9.0  --  8.7*   Liver Function Tests:  Recent Labs Lab 07/10/15 1441  AST 22  ALT 11*  ALKPHOS 97  BILITOT 0.9  PROT 7.8  ALBUMIN 3.8   No results for input(s): LIPASE, AMYLASE in the last 168 hours.  Recent Labs Lab 07/10/15 1920  AMMONIA 40*   CBC:  Recent Labs Lab 07/10/15 1441 07/10/15 1443 07/10/15 2130  WBC 10.8*  --  14.1*  NEUTROABS 8.4*  --   --   HGB 12.3 13.6 11.5*  HCT 37.0 40.0 35.2*  MCV 87.9  --  88.2  PLT 287  --  264   Cardiac Enzymes:  Recent Labs Lab 07/10/15 2130  TROPONINI 0.04*   BNP (last 3 results) No results for input(s): PROBNP in the last 8760 hours. CBG:  Recent Labs Lab 07/11/15 0338 07/11/15 0440 07/11/15 0545 07/11/15 0643 07/11/15 0756  GLUCAP 76 100* 116* 147* 184*    Recent Results (from the past 240 hour(s))  MRSA PCR Screening     Status: None   Collection Time:  07/10/15  6:15 PM  Result Value Ref Range Status   MRSA by PCR NEGATIVE NEGATIVE Final    Comment:        The GeneXpert MRSA Assay (FDA approved for NASAL specimens only), is one component of a comprehensive MRSA colonization surveillance program. It is not intended to diagnose MRSA infection nor to guide or monitor treatment for MRSA infections.       *     Studies: Ct Head Wo Contrast  07/10/2015  CLINICAL DATA:  Found unresponsive at [employer], complains of frontal headache EXAM: CT HEAD WITHOUT CONTRAST TECHNIQUE: Contiguous axial images were obtained from the base of the skull through the vertex without intravenous contrast. COMPARISON:  None. FINDINGS: Mild diffuse cortical atrophy. Moderately prominent low attenuation throughout the periventricular white matter somewhat advanced for patient age. Mild age-related left basal ganglia calcification. No evidence of  large vascular territory infarct. No hemorrhage or extra-axial fluid. No hydrocephalus. Calvarium is intact. No significant inflammatory change in the visualized portions of paranasal sinuses or mastoid air cells. IMPRESSION: Somewhat advanced involutional change for age but otherwise negative for acute abnormality. Electronically Signed   By: Esperanza Heir M.D.   On: 07/10/2015 14:58   Mr Brain Wo Contrast (neuro Protocol)  07/11/2015  ADDENDUM REPORT: 07/11/2015 09:16 ADDENDUM: This exam was reviewed in person with the clinical service by Dr. Karin Golden on the morning of 07/11/2015. They are strongly suspecting acute stroke syndrome. On review of this exam there is increased trace diffusion signal in the dorsal and medial right thalamus (series 3, image 26) compatible with acute thalamic lacunar infarct. This raises the possibility that the small right PCA territory foci of diffusion then are also related to acute right PCA territory ischemia rather than hemosiderin susceptibility as was initially suspected. CONCLUSION: Acute right PCA territory ischemia, including involvement of the dorsal right thalamus. No associated mass effect or acute hemorrhage. Electronically Signed   By: Odessa Fleming M.D.   On: 07/11/2015 09:16  07/11/2015  CLINICAL DATA:  54 year old female found with altered mental status at [employer]. Elevated blood pressure. Not following commands. Initial encounter. EXAM: MRI HEAD WITHOUT CONTRAST TECHNIQUE: Multiplanar, multiecho pulse sequences of the brain and surrounding structures were obtained without intravenous contrast. COMPARISON:  Head CT without contrast 1444 hours today. FINDINGS: Study is mildly degraded by motion artifact despite repeated imaging attempts. Major intracranial vascular flow voids are preserved with a degree of intracranial artery dolichoectasia. Mildly heterogeneous diffusion weighted imaging throughout the brain. Suggestion of 1 or 2 punctate foci of restricted diffusion  in the right occipital lobe (series 3, image 21 and series 5, image 5) appears instead to correspond to artifact from chronic micro hemorrhages (see series 10, images 10 and 11). There also appears to be chronic micro hemorrhage in the opposite left occipital pole (image 10). No definite restricted diffusion elsewhere. Underlying confluent bilateral cerebral white matter T2 and FLAIR hyperintensity, mostly periventricular but extending to subcortical white matter in some areas. T2 hyperintensity in the posterior limb of the right internal capsule appears chronic. There is also evidence of a chronic lacunar infarct in the left thalamus and also at the left caudothalamic groove. No cortical encephalomalacia identified. No midline shift, mass effect, evidence of mass lesion, ventriculomegaly, extra-axial collection or acute intracranial hemorrhage. Cervicomedullary junction and pituitary are within normal limits. Negative visualized cervical spine. Visible internal auditory structures appear normal. Mastoids are clear. Paranasal sinuses are clear. Orbit and scalp soft tissues appear within normal limits. Visualized  bone marrow signal is within normal limits. IMPRESSION: 1.  No acute intracranial abnormality identified. 2. Constellation of signal changes in the brain most compatible with age advanced chronic small vessel disease, including chronic micro hemorrhages in both occipital poles. Electronically Signed: By: Odessa Fleming M.D. On: 07/10/2015 16:36   Dg Chest Portable 1 View  07/10/2015  CLINICAL DATA:  54 year old current history of hypertension and diabetes, found earlier today sitting on a curb at [employer] outside of her car for an unknown amount of time. Hyperglycemia and hypertension. Left-sided weakness on clinical examination. EXAM: PORTABLE CHEST 1 VIEW COMPARISON:  None. FINDINGS: Cardiac silhouette upper normal in size to slightly enlarged for AP portable technique. Lungs clear. Bronchovascular markings  normal. Pulmonary vascularity normal. No visible pleural effusions. No pneumothorax. Degenerative changes throughout the thoracic spine. IMPRESSION: No acute cardiopulmonary disease. Electronically Signed   By: Hulan Saas M.D.   On: 07/10/2015 15:31        Scheduled Meds: . amLODipine  10 mg Oral Daily  . enoxaparin (LOVENOX) injection  40 mg Subcutaneous Q24H  . insulin aspart  0-15 Units Subcutaneous TID WC  . insulin aspart  0-5 Units Subcutaneous QHS  . [START ON 07/12/2015] insulin detemir  10 Units Subcutaneous Daily  . metoprolol tartrate  12.5 mg Oral BID  . piperacillin-tazobactam (ZOSYN)  IV  3.375 g Intravenous 3 times per day  . vancomycin  500 mg Intravenous Q12H   Continuous Infusions: . sodium chloride 75 mL/hr at 07/11/15 1610    Principal Problem:   Acute encephalopathy Active Problems:   CVA (cerebral infarction)   Left-sided weakness   Hyperglycemia   Acute kidney injury (HCC)   Hyponatremia   Essential hypertension   Acute renal failure (HCC)   Acute on chronic respiratory failure with hypercapnia (HCC)   Hypertensive emergency   Lactic acidosis    Time spent: 60 minutes.    Marcellus Scott, MD, FACP, FHM. [facility] Pager [phone]  If 7PM-7AM, please contact night-coverage [email or web address] Password [email or web address] 07/11/2015, 11:09 AM    LOS: 1 day

## 2015-07-11 NOTE — Progress Notes (Signed)
  Echocardiogram 2D Echocardiogram has been performed.  Kayla SavoyCasey [name] 07/11/2015, 9:37 AM

## 2015-07-11 NOTE — Progress Notes (Signed)
EEG completed, results pending. 

## 2015-07-11 NOTE — Clinical Documentation Improvement (Signed)
Hospitalist  Abnormal Lab/Test Results:   12/20:  Potassium: 2.8  Possible Clinical Conditions associated with below indicators  Hypokalemia  Other Condition  Cannot Clinically Determine   Treatment Provided: 12/20:  Kdur 40meq, 2 x 20 meq tabs q 4 hours per shift assessment.  Please exercise your independent, professional judgment when responding. A specific answer is not anticipated or expected.   Thank Sabino DonovanYou,  Lowella Kindley [facility] [place] [phone]

## 2015-07-11 NOTE — Evaluation (Signed)
Clinical/Bedside Swallow Evaluation Patient Details  Name: Kayla Donaldson MRN: [id number] Date of Birth: 10/16/1960  Today's Date: 07/11/2015 Time: SLP Start Time (ACUTE ONLY): 91470925 SLP Stop Time (ACUTE ONLY): 0941 SLP Time Calculation (min) (ACUTE ONLY): 16 min  Past Medical History:  Past Medical History  Diagnosis Date  . Hypertension   . Acute encephalopathy   . Type II diabetes mellitus (HCC)    Past Surgical History: History reviewed. No pertinent past surgical history. HPI:  54 year old female admitted 12/19 for acute encephalopathy of unclear etiology. She was profoundly hypertensive with SBP 210s and hyperglycemic with glucose 600s. She was agitated and was acidotic on ABG.   Assessment / Plan / Recommendation Clinical Impression  Pt consumed very little solid POs due to not liking choices offered, however what she did take in was consumed without incidence. Will start regular textures and thin liquids - SLP f/u not indicated for swallowing.   Of note, pt declined a speech/language evaluation at this time and MRI was negative for acute CVA; however, she seems to have word-finding difficulties in conversation, poor working memory, and reduced alternating attention. Will f/u an additional time to offer assessment.    Aspiration Risk  No limitations    Diet Recommendation  Regular textures, thin liquids   Medication Administration: Whole meds with liquid    Other  Recommendations Oral Care Recommendations: Oral care BID   Follow up Recommendations  None;24 hour supervision/assistance    Frequency and Duration            Prognosis        Swallow Study   General Date of Onset: 07/10/15 HPI: 54 year old female admitted 12/19 for acute encephalopathy of unclear etiology. She was profoundly hypertensive with SBP 210s and hyperglycemic with glucose 600s. She was agitated and was acidotic on ABG. Type of Study: Bedside Swallow Evaluation Previous Swallow Assessment:  none in chart Diet Prior to this Study: NPO Temperature Spikes Noted: No Respiratory Status: Room air History of Recent Intubation: No Behavior/Cognition: Alert;Cooperative;Requires cueing Oral Cavity Assessment: Within Functional Limits Oral Care Completed by SLP: No Oral Cavity - Dentition: Adequate natural dentition Vision: Functional for self-feeding Self-Feeding Abilities: Able to feed self Patient Positioning: Upright in bed Baseline Vocal Quality: Normal Volitional Cough: Strong    Oral/Motor/Sensory Function Overall Oral Motor/Sensory Function: Within functional limits   Ice Chips Ice chips: Not tested   Thin Liquid Thin Liquid: Within functional limits Presentation: Cup;Self Fed;Straw    Nectar Thick Nectar Thick Liquid: Not tested   Honey Thick Honey Thick Liquid: Not tested   Puree Puree: Not tested (pt refused)   Solid Solid: Within functional limits Presentation: Self Fed      Maxcine HamLaura Paiewonsky, M.A. CCC-SLP [phone]  Maxcine Hamaiewonsky, Marqueze Ramcharan 07/11/2015,9:48 AM

## 2015-07-11 NOTE — Progress Notes (Signed)
STROKE TEAM PROGRESS NOTE   HISTORY Kayla Donaldson is an 54 y.o. female with hx of DM,HTN who was found down in front of [employer], unknown time of onset presented with L sided weakness, L gaze preference , nystagmus, glucose of 600, BP of 237/105 and CO2 of 71 and Ph 7.25 Last known well unable to determine. Patient was not administered TPA secondary to unknown time of onset. She was admitted for further evaluation and treatment.   SUBJECTIVE (INTERVAL HISTORY) Patient`s history personally reviewed Patient and Family Members. She Is Had a Recent Admission for Pneumonia and Was Discharged Last Week. She Was on Levaquin Which She Stopped 4 Days Ago and Switched to Doxycycline. She Has Not Been Significant Improvement after Admission Was Almost Back to Baseline.   OBJECTIVE Temp:  [98 F (36.7 C)-99.2 F (37.3 C)] 98.6 F (37 C) (12/20 1225) Pulse Rate:  [81-108] 81 (12/20 1206) Cardiac Rhythm:  [-] Normal sinus rhythm (12/20 0757) Resp:  [10-29] 17 (12/20 1225) BP: (147-229)/(75-116) 157/75 mmHg (12/20 1225) SpO2:  [87 %-100 %] 98 % (12/20 0200) Weight:  [63.2 kg (139 lb 5.3 oz)] 63.2 kg (139 lb 5.3 oz) (12/19 1800)  CBC:  Recent Labs Lab 07/10/15 1441  07/10/15 2130 07/11/15 1112  WBC 10.8*  --  14.1* 11.2*  NEUTROABS 8.4*  --   --  8.9*  HGB 12.3  < > 11.5* 10.2*  HCT 37.0  < > 35.2* 31.2*  MCV 87.9  --  88.2 89.4  PLT 287  --  264 224  < > = values in this interval not displayed.  Basic Metabolic Panel:   Recent Labs Lab 07/10/15 2130 07/11/15 1112  NA 139 140  K 3.6 2.8*  CL 102 107  CO2 26 24  GLUCOSE 343* 200*  BUN 11 9  CREATININE 0.89 0.84  CALCIUM 8.7* 8.2*  MG  --  1.6*    Lipid Panel:     Component Value Date/Time   CHOL 323* 07/10/2015 1848   TRIG 162* 07/10/2015 1848   HDL 88 07/10/2015 1848   CHOLHDL 3.7 07/10/2015 1848   VLDL 32 07/10/2015 1848   LDLCALC 203* 07/10/2015 1848   HgbA1c:  Lab Results  Component Value Date   HGBA1C 14.2*  07/10/2015   Urine Drug Screen:     Component Value Date/Time   LABOPIA NONE DETECTED 07/10/2015 1652   COCAINSCRNUR NONE DETECTED 07/10/2015 1652   LABBENZ NONE DETECTED 07/10/2015 1652   AMPHETMU NONE DETECTED 07/10/2015 1652   THCU NONE DETECTED 07/10/2015 1652   LABBARB NONE DETECTED 07/10/2015 1652      IMAGING  Ct Head Wo Contrast 07/10/2015   Somewhat advanced involutional change for age but otherwise negative for acute abnormality.   Mr Brain Wo Contrast (neuro Protocol) 07/10/2015  1.  Small R thalamic infarct. 2. Constellation of signal changes in the brain most compatible with age advanced chronic small vessel disease, including chronic micro hemorrhages in both occipital poles.   Dg Chest Portable 1 View 07/10/2015  No acute cardiopulmonary disease.   2D Echocardiogram  - Left ventricle: The cavity size was normal. Wall thickness wasincreased in a pattern of mild LVH. Systolic function was normal. The estimated ejection fraction was in the range of 60% to 65%.Wall motion was normal; there were no regional wall motionabnormalities. Features are consistent with a pseudonormal leftventricular filling pattern, with concomitant abnormal relaxationand increased filling pressure (grade 2 diastolic dysfunction). - Aortic valve: There was mild regurgitation. - Mitral  valve: There was mild regurgitation. - Right atrium: The atrium was mildly dilated.   PHYSICAL EXAM Pleasant middle-aged lady currently not in distress. . Afebrile. Head is nontraumatic. Neck is supple without bruit.    Cardiac exam no murmur or gallop. Lungs are clear to auscultation. Distal pulses are well felt. Neurological Exam :  Awake alert oriented 2. Diminished attention, registration and recall. Speech is quite fluent but only occasional word finding difficulties. No paraphasic errors. Able to name repeat and comprehend quite well. Pupils equal reactive. Fundi were not visualized. Vision acuity seems  adequate. Extraocular moments are full range without nystagmus. Visual fields is full to bedside confrontational testing. Face is symmetric without weakness. Tongue is midline. Motor system exam revealed no upper or lower extremity drift. Symmetric and equal strength in all 4 extremities. Deep tendon reflexes are 1+ symmetric. Plantars are downgoing. ASSESSMENT/PLAN Kayla Donaldson is a 54 y.o. female with history of hypertension, diabetes presenting with altered mental state. She did not receive IV t-PA due to unknown last known well.   Small right thalamic diffusio positive signal abnormality infarct vs sequela of seizure. Most likely seizure. If infarct, felt to be secondary to small vessel disease source  MRI  Small R thalamic infarct  MRA  No ordered  TCD ordered  Carotid Doppler  pending   2D Echo  No source of embolus   Check EEG  LDL 203  HgbA1c 14.2  Lovenox 40 mg sq daily for VTE prophylaxis Diet Carb Modified Fluid consistency:: Thin; Room service appropriate?: Yes  No antithrombotic prior to admission, now on No antithrombotic. Add aspirin.  Ongoing aggressive stroke risk factor management  Therapy recommendations:  No PT  Disposition:  pending   Respiratory failure  On BIPAP  Hypertensive Emergency  Elevated  BP 229/109 Permissive hypertension (OK if < 220/120) but gradually normalize in 5-7 days  Hyperlipidemia  Home meds:  No statin  LDL 203, goal < 70  Added lipitor 40  Continue statin at discharge  Diabetes type II  Uncontrolled  HgbA1c 14.2, goal < 7.0  Other Stroke Risk Factors  Family hx stroke (maternal grandfather)  Other Active Problems  Acute renal failure, Acute on chronic hypercapnic respiratory failure/very mild anion gap metabolic acidosis  Acute kidney injury  Hypernatremia  Lactic acidosis, 2.3  Leukocytosis, WBC 14  Anemia  Mildly elevated troponin  Hospital day # 1  I have personally examined this  patient, reviewed notes, independently viewed imaging studies, participated in medical decision making and plan of care. I have made any additions or clarifications directly to the above note. Agree with note above. She presented with confusion and disorientation with speech difficulties in the setting of recent pneumonia with antibiotics possible encephalopathy versus TIA since brain imaging is negative. She remains at risk for neurological worsening, recurrent seizures, TIA needs ongoing evaluation. Recommend EEG. Discussed with family at the bedside and answered questions.  Delia Heady, MD Medical Director [facility] Pager: [phone] 07/11/2015 7:50 PM     To contact Stroke Continuity provider, please refer to [email or web address]. After hours, contact General Neurology

## 2015-07-11 NOTE — Evaluation (Signed)
Physical Therapy Evaluation Patient Details Name: Kayla LorenzoSaralyn Donaldson MRN: [id number] DOB: Dec 28, 1960 Today's Date: 07/11/2015   History of Present Illness  54 year old female admitted 12/19 for acute encephalopathy of unclear etiology. She was profoundly hypertensive with SBP 210s and hyperglycemic with glucose 600s. She was agitated and was acidotic on ABG. MRI positive for Acute right PCA territory ischemia, including involvement of the dorsal right thalamus.  Clinical Impression  Patient presents close to functional baseline with some imbalance with quick turns.  Will continue acute level PT to further assess balance, continue safety education and further encourage pt to rely on family support due to poor safety awareness.    Follow Up Recommendations No PT follow up    Equipment Recommendations  None recommended by PT    Recommendations for Other Services       Precautions / Restrictions Precautions Precautions: None      Mobility  Bed Mobility Overal bed mobility: Modified Independent             General bed mobility comments: use of bed rail and HOB elevated   Transfers Overall transfer level: Modified independent               General transfer comment: stood without assist while I was trying to ready a chair for her, bed alarm went off  Ambulation/Gait Ambulation/Gait assistance: Supervision Ambulation Distance (Feet): 200 Feet Assistive device: None Gait Pattern/deviations: Step-through pattern     General Gait Details: mild R lateral/posterior balance loss self recovery with turn and stop, intact with head turns and nods and speed changes  Stairs            Wheelchair Mobility    Modified Rankin (Stroke Patients Only) Modified Rankin (Stroke Patients Only) Pre-Morbid Rankin Score: No symptoms Modified Rankin: No significant disability     Balance Overall balance assessment: No apparent balance deficits (not formally assessed)                                            Pertinent Vitals/Pain Pain Assessment: No/denies pain    Home Living Family/patient expects to be discharged to:: Private residence Living Arrangements: Alone Available Help at Discharge: Family;Available PRN/intermittently Type of Home: House Home Access: Level entry     Home Layout: One level Home Equipment: None Additional Comments: may stay with family initially after d/c    Prior Function Level of Independence: Independent               Hand Dominance        Extremity/Trunk Assessment   Upper Extremity Assessment: Overall WFL for tasks assessed           Lower Extremity Assessment: Overall WFL for tasks assessed         Communication   Communication: No difficulties  Cognition Arousal/Alertness: Awake/alert Behavior During Therapy: WFL for tasks assessed/performed Overall Cognitive Status: Impaired/Different from baseline Area of Impairment: Safety/judgement         Safety/Judgement: Decreased awareness of safety;Decreased awareness of deficits     General Comments: denies she has had a stroke, states she can do everything, trying to think of what she had to run up her sugar, but not correlating with not taking insulin    General Comments      Exercises        Assessment/Plan    PT Assessment Patient  needs continued PT services  PT Diagnosis Abnormality of gait   PT Problem List Decreased balance;Decreased cognition;Decreased safety awareness  PT Treatment Interventions Gait training;Balance training;Stair training;Functional mobility training;Patient/family education;Therapeutic activities;Therapeutic exercise   PT Goals (Current goals can be found in the Care Plan section) Acute Rehab PT Goals Patient Stated Goal: To return to normal PT Goal Formulation: With patient Time For Goal Achievement: 07/18/15 Potential to Achieve Goals: Good    Frequency Min 3X/week   Barriers to  discharge        Co-evaluation               End of Session   Activity Tolerance: Patient tolerated treatment well Patient left: in bed;with call bell/phone within reach;with family/visitor present;with bed alarm set           Time: 1610-9604 PT Time Calculation (min) (ACUTE ONLY): 27 min   Charges:   PT Evaluation $Initial PT Evaluation Tier I: 1 Procedure PT Treatments $Gait Training: 8-22 mins   PT G CodesElray Mcgregor July 17, 2015, 1:44 PM  Sheran Lawless, PT [phone] 2015/07/17

## 2015-07-11 NOTE — Procedures (Addendum)
History: 54 yo F presenting with altered mental status  Sedation: None  Technique: This is a 21 channel routine scalp EEG performed at the bedside with bipolar and monopolar montages arranged in accordance to the international 10/20 system of electrode placement. One channel was dedicated to EKG recording.    Background: The background is asymmetric with it better visualized on the left than right. In addition, there is irregular generalized delta  Activity. There are occasional shapr waves seen at T8 > F8 > Fp2,F4. There is a brief approximately 30 second seizure first seen as irregular theat activity in F8, T8 with evolution in frequency and morphology becoming sharply contoured then finally evolving to delta activty before abruptly ceasing.   Photic stimulation: Physiologic driving is not performed  EEG Abnormalities: 1) Brief electrographic seizure appearing to arise from the right anterior temporal region 2) right frontotemporal sharp waves 3) asymmetric PDR  Clinical Interpretation: This EEG recorded a brief electrographic seizure arising from the right, likely temporal region. There was also evidence of right posterior quadrant dysfunction interictally. This was communicated to the treating neurology team at the time of interpretation.  Ritta SlotMcNeill Natia Fahmy, MD [facility] [phone]  If 7pm- 7am, please page neurology on call as listed in AMION.

## 2015-07-11 NOTE — Progress Notes (Addendum)
PCCM Interval Note  Pt's MS has improved. Discussed case with Dr Pearlean BrownieSethi at bedside. No new interventions to make. Her metabolic disarray and suspected / possible seizure are main issues.   PLease call if we can help you  Levy Pupaobert Daine Croker, MD, PhD 07/11/2015, 11:03 AM [facility] [phone] or if no answer [phone]

## 2015-07-12 ENCOUNTER — Other Ambulatory Visit (HOSPITAL_COMMUNITY): Payer: Federal, State, Local not specified - PPO

## 2015-07-12 DIAGNOSIS — R569 Unspecified convulsions: Secondary | ICD-10-CM

## 2015-07-12 DIAGNOSIS — I10 Essential (primary) hypertension: Secondary | ICD-10-CM

## 2015-07-12 LAB — BASIC METABOLIC PANEL
Anion gap: 8 (ref 5–15)
BUN: 7 mg/dL (ref 6–20)
CALCIUM: 8.8 mg/dL — AB (ref 8.9–10.3)
CHLORIDE: 107 mmol/L (ref 101–111)
CO2: 27 mmol/L (ref 22–32)
CREATININE: 0.85 mg/dL (ref 0.44–1.00)
GFR calc Af Amer: >60 mL/min (ref >60)
GFR calc non Af Amer: >60 mL/min (ref >60)
GLUCOSE: 193 mg/dL — AB (ref 65–99)
Potassium: 3.3 mmol/L — ABNORMAL LOW (ref 3.5–5.1)
Sodium: 142 mmol/L (ref 135–145)

## 2015-07-12 LAB — GLUCOSE, CAPILLARY
GLUCOSE-CAPILLARY: 158 mg/dL — AB (ref 65–99)
Glucose-Capillary: 154 mg/dL — ABNORMAL HIGH (ref 65–99)
Glucose-Capillary: 127 mg/dL — ABNORMAL HIGH (ref 65–99)
Glucose-Capillary: 139 mg/dL — ABNORMAL HIGH (ref 65–99)

## 2015-07-12 LAB — TROPONIN I: TROPONIN I: 0.04 ng/mL — AB (ref <0.031)

## 2015-07-12 LAB — MAGNESIUM: Magnesium: 1.8 mg/dL (ref 1.7–2.4)

## 2015-07-12 MED ORDER — POTASSIUM CHLORIDE CRYS ER 20 MEQ PO TBCR
30.0000 meq | EXTENDED_RELEASE_TABLET | ORAL | Status: AC
  Administered 2015-07-12 (×2): 30 meq via ORAL
  Filled 2015-07-12 (×2): qty 2

## 2015-07-12 NOTE — Progress Notes (Signed)
STROKE TEAM PROGRESS NOTE   HISTORY Kayla Donaldson is an 54 y.o. female with hx of DM,HTN who was found down in front of [employer], unknown time of onset presented with L sided weakness, L gaze preference , nystagmus, glucose of 600, BP of 237/105 and CO2 of 71 and Ph 7.25 Last known well unable to determine. Patient was not administered TPA secondary to unknown time of onset. She was admitted for further evaluation and treatment.   SUBJECTIVE (INTERVAL HISTORY)  Patient and family that she has made significant improvement in mental status is rapidly improving. She has had no weakness documented seizure activity but EEG done yesterday showed a brief electrographic seizure appearing to arise from right anterior temporal region. She was given IV Keppra and started on Keppra 500 mg daily.  OBJECTIVE Temp:  [97.9 F (36.6 C)-98.4 F (36.9 C)] 98.4 F (36.9 C) (12/21 1249) Pulse Rate:  [69-80] 73 (12/21 1057) Cardiac Rhythm:  [-] Normal sinus rhythm (12/21 0700) Resp:  [12-21] 18 (12/21 1249) BP: (133-186)/(69-105) 134/69 mmHg (12/21 1249) SpO2:  [96 %-100 %] 100 % (12/21 1249)  CBC:  Recent Labs Lab 07/10/15 1441  07/10/15 2130 07/11/15 1112  WBC 10.8*  --  14.1* 11.2*  NEUTROABS 8.4*  --   --  8.9*  HGB 12.3  < > 11.5* 10.2*  HCT 37.0  < > 35.2* 31.2*  MCV 87.9  --  88.2 89.4  PLT 287  --  264 224  < > = values in this interval not displayed.  Basic Metabolic Panel:   Recent Labs Lab 07/11/15 1112 07/12/15 0910  NA 140 142  K 2.8* 3.3*  CL 107 107  CO2 24 27  GLUCOSE 200* 193*  BUN 9 7  CREATININE 0.84 0.85  CALCIUM 8.2* 8.8*  MG 1.6* 1.8    Lipid Panel:     Component Value Date/Time   CHOL 323* 07/10/2015 1848   TRIG 162* 07/10/2015 1848   HDL 88 07/10/2015 1848   CHOLHDL 3.7 07/10/2015 1848   VLDL 32 07/10/2015 1848   LDLCALC 203* 07/10/2015 1848   HgbA1c:  Lab Results  Component Value Date   HGBA1C 14.2* 07/10/2015   Urine Drug Screen:     Component  Value Date/Time   LABOPIA NONE DETECTED 07/10/2015 1652   COCAINSCRNUR NONE DETECTED 07/10/2015 1652   LABBENZ NONE DETECTED 07/10/2015 1652   AMPHETMU NONE DETECTED 07/10/2015 1652   THCU NONE DETECTED 07/10/2015 1652   LABBARB NONE DETECTED 07/10/2015 1652      IMAGING  Ct Head Wo Contrast 07/10/2015   Somewhat advanced involutional change for age but otherwise negative for acute abnormality.   Mr Brain Wo Contrast (neuro Protocol) 07/10/2015  1.  Small R thalamic infarct. 2. Constellation of signal changes in the brain most compatible with age advanced chronic small vessel disease, including chronic micro hemorrhages in both occipital poles.   Dg Chest Portable 1 View 07/10/2015  No acute cardiopulmonary disease.   2D Echocardiogram  - Left ventricle: The cavity size was normal. Wall thickness wasincreased in a pattern of mild LVH. Systolic function was normal. The estimated ejection fraction was in the range of 60% to 65%.Wall motion was normal; there were no regional wall motionabnormalities. Features are consistent with a pseudonormal leftventricular filling pattern, with concomitant abnormal relaxationand increased filling pressure (grade 2 diastolic dysfunction). - Aortic valve: There was mild regurgitation. - Mitral valve: There was mild regurgitation. - Right atrium: The atrium was mildly dilated.  PHYSICAL EXAM Pleasant middle-aged lady currently not in distress. . Afebrile. Head is nontraumatic. Neck is supple without bruit.    Cardiac exam no murmur or gallop. Lungs are clear to auscultation. Distal pulses are well felt. Neurological Exam :  Awake alert oriented 2. Diminished attention, registration and recall. Speech is quite fluent but only occasional word finding difficulties. No paraphasic errors. Able to name repeat and comprehend quite well. Pupils equal reactive. Fundi were not visualized. Vision acuity seems adequate. Extraocular moments are full range  without nystagmus. Visual fields is full to bedside confrontational testing. Face is symmetric without weakness. Tongue is midline. Motor system exam revealed no upper or lower extremity drift. Symmetric and equal strength in all 4 extremities. Deep tendon reflexes are 1+ symmetric. Plantars are downgoing. ASSESSMENT/PLAN Kayla Donaldson is a 54 y.o. female with history of hypertension, diabetes presenting with altered mental state. She did not receive IV t-PA due to unknown last known well.    Small right thalamic diffusion positive signal abnormality likely from seizure.  MRI  Small R thalamic infarct  MRA  No ordered  TCD ordered  Carotid Doppler  pending   2D Echo  No source of embolus     EEG electrographic seizure arising from right anterior temporal region  LDL 203  HgbA1c 14.2  Lovenox 40 mg sq daily for VTE prophylaxis Diet Carb Modified Fluid consistency:: Thin; Room service appropriate?: Yes  No antithrombotic prior to admission, now on No antithrombotic. Add aspirin.  Ongoing aggressive stroke risk factor management  Therapy recommendations:  No PT  Disposition:  pending   Respiratory failure  On BIPAP  Hypertensive Emergency  Elevated  BP 229/109 Permissive hypertension (OK if < 220/120) but gradually normalize in 5-7 days  Hyperlipidemia  Home meds:  No statin  LDL 203, goal < 70  Added lipitor 40  Continue statin at discharge  Diabetes type II  Uncontrolled  HgbA1c 14.2, goal < 7.0  Other Stroke Risk Factors  Family hx stroke (maternal grandfather)  Other Active Problems  Acute renal failure, Acute on chronic hypercapnic respiratory failure/very mild anion gap metabolic acidosis  Acute kidney injury  Hypernatremia  Lactic acidosis, 2.3  Leukocytosis, WBC 14  Anemia  Mildly elevated troponin  Hospital day # 2  I have personally examined this patient, reviewed notes, independently viewed imaging studies, participated in  medical decision making and plan of care. I have made any additions or clarifications directly to the above note. Agree with note above. She presented with confusion and disorientation with speech difficulties in the setting of recent pneumonia with antibiotics possible encephalopathy from unwitnessed seizure. EEG shows electrographic seizure arising from right anterior temporal lobe.  She remains at risk for neurological worsening, recurrent seizures, continue Keppra for seizure prophylaxis.Marland Kitchen. Discussed with family at the bedside and answered questions.  Delia HeadyPramod Sethi, MD Medical Director [facility] Pager: [phone] 07/12/2015 2:32 PM     To contact Stroke Continuity provider, please refer to [email or web address]. After hours, contact General Neurology

## 2015-07-12 NOTE — Progress Notes (Signed)
Occupational Therapy Treatment Patient Details Name: Kayla Donaldson MRN: [id number] DOB: 01-16-1961 Today's Date: 07/12/2015    History of present illness 54 year old female admitted 12/19 for acute encephalopathy of unclear etiology. She was profoundly hypertensive with SBP 210s and hyperglycemic with glucose 600s. She was agitated and was acidotic on ABG. MRI positive for Acute right PCA territory ischemia, including involvement of the dorsal right thalamus.   OT comments  Pt presents to OT with continued cognitive deficit.  She demonstrates difficulty dividing and alternating attention, is very tangential, with poor self monitoring.  She was able to perform calculations and serial subtraction without error, but is quick to dismiss deficits.  She works as a disability judge.  Her job is fairly rote and is very familiar to her as she has been in that position for an extended period of time so she may perform better than anticipated, but level of distraction, and organization may be issues.   She definitely needs follow up OT, but she is adamantly refusing follow up stating she is fine.    Follow Up Recommendations  Home health OT;Supervision/Assistance - 24 hour    Equipment Recommendations  None recommended by OT    Recommendations for Other Services      Precautions / Restrictions Precautions Precautions: None       Mobility Bed Mobility Overal bed mobility: Modified Independent                Transfers Overall transfer level: Modified independent                    Balance                                   ADL                                         General ADL Comments: Pt requires supervision for ADLs       Vision                     Perception     Praxis      Cognition   Behavior During Therapy: WFL for tasks assessed/performed Overall Cognitive Status: Impaired/Different from baseline Area of  Impairment: Attention;Memory;Safety/judgement;Awareness;Problem solving   Current Attention Level: Selective    Following Commands: Follows one step commands consistently Safety/Judgement: Decreased awareness of safety;Decreased awareness of deficits Awareness: Intellectual Problem Solving: Difficulty sequencing;Requires verbal cues;Requires tactile cues General Comments: Pt works as a disability judge.  She adamantly refutes that she has any cognitive deficits and quickly [facility] by SLP.  Pt was able to perform serial subtraction by 7s from 100 without error.  She was able selectively attend to task, but demonstrated difficulty alternating and dividing attention.  When discussing her work related tasks, the are very familiar to her, and fairly black and white when she is deciding a disability claim - so she may do okay with these tasks depending on the level of distraction in her environment, but she would benefit from further testing, if she would agree.   She is very tangential, and at times difficult to redirect.    Extremity/Trunk Assessment               Exercises  Shoulder Instructions       General Comments      Pertinent Vitals/ Pain       Pain Assessment: No/denies pain  Home Living     Available Help at Discharge: Family;Available PRN/intermittently Type of Home: House                              Lives With: Alone    Prior Functioning/Environment              Frequency Min 2X/week     Progress Toward Goals  OT Goals(current goals can now be found in the care plan section)     ADL Goals Pt Will Perform Grooming: with modified independence;standing (2 tasks without VCs for sequencing) Pt Will Perform Upper Body Bathing: Independently (no VCs needed) Pt Will Perform Lower Body Bathing: Independently (no VCs needed) Pt Will Perform Upper Body Dressing: Independently (no VCs needed) Pt Will  Perform Lower Body Dressing: Independently;sit to/from stand (no VCs needed) Pt Will Transfer to Toilet: Independently;ambulating Pt Will Perform Toileting - Clothing Manipulation and hygiene: Independently;sit to/from stand Additional ADL Goal #1: Pt will follow 2 and 3 step commands without VCs  Plan Discharge plan remains appropriate    Co-evaluation                 End of Session     Activity Tolerance Patient tolerated treatment well   Patient Left in bed;with call bell/phone within reach;with nursing/sitter in room   Nurse Communication Mobility status        Time: 1410-1507 OT Time Calculation (min): 57 min  Charges: OT General Charges $OT Visit: 1 Procedure OT Treatments $Cognitive Skills Development: 38-52 mins  Waqas Bruhl M 07/12/2015, 3:57 PM

## 2015-07-12 NOTE — Progress Notes (Addendum)
PROGRESS NOTE    Jamea Robicheaux GGY:[id number] DOB: [birth year] DOA: 07/10/2015 PCP: No primary care provider on file.  HPI/Brief narrative 54 year old female patient with history of DM, HTN, noncompliant with medications, hospitalized at [facility] 06/18/15-06/23/15 for sepsis secondary to cellulitis and perivulvar abscess, poorly controlled DM & HTN, who was found down/altered mental status in front of [employer] on 12/19-unknown time of onset and presented to ED with left-sided weakness, left gaze preference, nystagmus, glucose 600, BP 237/105, CO2 71 and pH of 7.25. Neurology and CCM consulted. MRI brain negative for stroke. Neurology not working up for possible seizures. Admitted to stepdown, treated with insulin drip and CBGs improved. Mental status improved.  Assessment/Plan:  Acute encephalopathy - Initial DD: CVA  vs seizures(most likely) , probablemetabolic (HONK and hypertensive emergency-both improved), unlikely infectious given negative work up. - Seems to have resolved. - Driving recommendation to be made prior to discharge, based upon neurology recommendations. - RPR unreactive, HIV antibody nonreactive, blood cultures 2 to date.. UDS yet to be sent.  Poorly controlled type II DM with HONK - No DKA in the absence of ketones in her urine. - Treated with aggressive IV fluids and IV insulin drip. CBGs have improved in the 100-184 mg per DL range. - Patient seems to be noncompliant with medications and has several reasons. She states that when she was discharged from [facility] - she was not educated to give herself insulin's and hence has not been taking same. Also not sure if she's been taking her tablets. She was supposed to be on Levemir 10 units daily and metformin 500 MG twice a day. - A1c: 14.2 (glucose: 652) suggesting very poor control. - Started her back on Lantus/Levemir 10 units daily. SSI. Metformin on hold which can be resumed at discharge. - Diabetes  coordinator consulted for all aspects of diabetes education. - Her insulins may have to be titrated prior to discharge.  Hypertensive emergency - Initial blood pressure in the emergency department: 246/149 mmHg. - Treated with when necessary IV hydralazine and labetalol. Improved.  - Noncompliant with medications. She was supposed to be on amlodipine 10 MG daily, metoprolol 6.25 MG twice a day (could not break pill into this unusally small dose). - Start her back on amlodipine 10 MG daily and metoprolol 12.5 MG twice a day - She would benefit from being on a ACEI/ARB given her history of diabetes and may be considered either prior to discharge or as outpatient.  Small right thalamic diffusio positive signal abnormality infarct vs sequela of seizure - With resultant left-sided weakness-seems to have resolved. - CT head: Negative for acute abnormality - Neurology following and stroke workup & Mx per neurology. - Not on aspirin prior to admission, antiplatelets per neurology.   Seizures - EEG with evidence of seizures arising from right, likely temporal region , Managed by neurology, on Keppra . Neurology  Acute on chronic hypercapnic respiratory failure/very mild anion gap metabolic acidosis - Likely precipitated by metabolic derangement and altered mental status. - Did not require BiPAP or intubation. - CCM consultation and follow-up appreciated. - Wean off of oxygen as tolerated to keep saturations >92%. - Improved.  Acute kidney injury - Secondary to dehydration from hyperglycemia. Resolved.  Lactic acidosis - Continue hydration with IV fluids. Follow lactate and trend to normal.  Hypokalemia/hypomagnesemia - Repleted, recheck today  Leukocytosis - Unclear etiology. May be related to acute stress. - Chest x-ray negative. Urine microscopy not suspicious for UTI. Blood Cultures remain  negative, will discontinue IV antibiotics.  Anemia - Follow CBCs.    Minimally elevated  troponin - No reported chest pain. Probably stress response, flat troponin trend.  2-D echo with EF 60-65%, with no regional wall motion abnormality.   Hyperlipidemia - LDL 203. Started on Lipitor   DVT prophylaxis: Lovenox  Code Status: Full Family Communication: None at bedside Disposition Plan: Continue management in stepdown .   Consultants:  CCM- signed off   Neurology  Procedures:  None   Antibiotics:  IV Zosyn 12/19 >12/21   IV vancomycin 12/19 >12/21  Subjective: Denies complaints. Denies chest pain, headache, cough or dyspnea. No fever or chills. Has no recollection of events of last night.   Objective: Filed Vitals:   07/11/15 2216 07/12/15 0122 07/12/15 0400 07/12/15 0815  BP: 169/93 147/83  186/105  Pulse: 80 69    Temp:  98.4 F (36.9 C)  98 F (36.7 C)  TempSrc:  Oral  Oral  Resp:  _0 Height:      Weight:      SpO2:  100% 96% 100%   oxygen saturation: 98%   Intake/Output Summary (Last 24 hours) at 07/12/15 0944 Last data filed at 07/12/15 0825  Gross per 24 hour  Intake   1230 ml  Output      0 ml  Net   1230 ml   Filed Weights   07/10/15 1800  Weight: 63.2 kg (139 lb 5.3 oz)     Exam:  General exam: Pleasant middle aged female patient lying comfortably , no apparent distress.  Respiratory system: Clear. No increased work of breathing. Cardiovascular system: S1 & S2 heard, RRR. No JVD, murmurs, gallops, clicks or pedal edema.  Gastrointestinal system: Abdomen is nondistended, soft and nontender. Normal bowel sounds heard. Central nervous system: Alert and oriented. No focal neurological deficits. Extremities: Symmetric 5 x 5 power.   Data Reviewed: Basic Metabolic Panel:  Recent Labs Lab 07/10/15 1441 07/10/15 1443 07/10/15 2130 07/11/15 1112  NA 132* 133* 139 140  K 4.4 4.0 3.6 2.8*  CL 95* 94* 102 107  CO2 24  --  26 24  GLUCOSE 652* 612* 343* 200*  BUN _1 CREATININE 1.41* 1.20* 0.89 0.84  CALCIUM  9.0  --  8.7* 8.2*  MG  --   --   --  1.6*   Liver Function Tests:  Recent Labs Lab 07/10/15 1441 07/11/15 1112  AST 22 18  ALT 11* 10*  ALKPHOS 97 75  BILITOT 0.9 0.5  PROT 7.8 6.6  ALBUMIN 3.8 3.1*   No results for input(s): LIPASE, AMYLASE in the last 168 hours.  Recent Labs Lab 07/10/15 1920  AMMONIA 40*   CBC:  Recent Labs Lab 07/10/15 1441 07/10/15 1443 07/10/15 2130 07/11/15 1112  WBC 10.8*  --  14.1* 11.2*  NEUTROABS 8.4*  --   --  8.9*  HGB 12.3 13.6 11.5* 10.2*  HCT 37.0 40.0 35.2* 31.2*  MCV 87.9  --  88.2 89.4  PLT 287  --  264 224   Cardiac Enzymes:  Recent Labs Lab 07/10/15 2130 07/11/15 1112 07/11/15 1908 07/11/15 2334  TROPONINI 0.04* 0.06* 0.05* 0.04*   BNP (last 3 results) No results for input(s): PROBNP in the last 8760 hours. CBG:  Recent Labs Lab 07/11/15 0756 07/11/15 1224 07/11/15 1707 07/11/15 2216 07/12/15 0812  GLUCAP 184* 231* 117* 186* 154*    Recent Results (from the past 240 hour(s))  MRSA PCR Screening     Status: None   Collection Time: 07/10/15  6:15 PM  Result Value Ref Range Status   MRSA by PCR NEGATIVE NEGATIVE Final    Comment:        The GeneXpert MRSA Assay (FDA approved for NASAL specimens only), is one component of a comprehensive MRSA colonization surveillance program. It is not intended to diagnose MRSA infection nor to guide or monitor treatment for MRSA infections.   Culture, blood (Routine X 2) w Reflex to ID Panel     Status: None (Preliminary result)   Collection Time: 07/10/15  8:50 PM  Result Value Ref Range Status   Specimen Description BLOOD RIGHT ARM  Final   Special Requests IN PEDIATRIC BOTTLE 1CC  Final   Culture NO GROWTH < 24 HOURS  Final   Report Status PENDING  Incomplete  Culture, blood (Routine X 2) w Reflex to ID Panel     Status: None (Preliminary result)   Collection Time: 07/10/15  8:55 PM  Result Value Ref Range Status   Specimen Description BLOOD RIGHT HAND   Final   Special Requests IN PEDIATRIC BOTTLE 2CC  Final   Culture NO GROWTH < 24 HOURS  Final   Report Status PENDING  Incomplete           Studies: Ct Head Wo Contrast  07/10/2015  CLINICAL DATA:  Found unresponsive at [place], complains of frontal headache EXAM: CT HEAD WITHOUT CONTRAST TECHNIQUE: Contiguous axial images were obtained from the base of the skull through the vertex without intravenous contrast. COMPARISON:  None. FINDINGS: Mild diffuse cortical atrophy. Moderately prominent low attenuation throughout the periventricular white matter somewhat advanced for patient age. Mild age-related left basal ganglia calcification. No evidence of large vascular territory infarct. No hemorrhage or extra-axial fluid. No hydrocephalus. Calvarium is intact. No significant inflammatory change in the visualized portions of paranasal sinuses or mastoid air cells. IMPRESSION: Somewhat advanced involutional change for age but otherwise negative for acute abnormality. Electronically Signed   By: Skipper Cliche M.D.   On: 07/10/2015 14:58   Mr Brain Wo Contrast (neuro Protocol)  07/11/2015  ADDENDUM REPORT: 07/11/2015 09:16 ADDENDUM: This exam was reviewed in person with the clinical service by Dr. Maree Erie on the morning of 07/11/2015. They are strongly suspecting acute stroke syndrome. On review of this exam there is increased trace diffusion signal in the dorsal and medial right thalamus (series 3, image 26) compatible with acute thalamic lacunar infarct. This raises the possibility that the small right PCA territory foci of diffusion then are also related to acute right PCA territory ischemia rather than hemosiderin susceptibility as was initially suspected. CONCLUSION: Acute right PCA territory ischemia, including involvement of the dorsal right thalamus. No associated mass effect or acute hemorrhage. Electronically Signed   By: Genevie Ann M.D.   On: 07/11/2015 09:16  07/11/2015  CLINICAL DATA:   54 year old female found with altered mental status at [place]. Elevated blood pressure. Not following commands. Initial encounter. EXAM: MRI HEAD WITHOUT CONTRAST TECHNIQUE: Multiplanar, multiecho pulse sequences of the brain and surrounding structures were obtained without intravenous contrast. COMPARISON:  Head CT without contrast 1444 hours today. FINDINGS: Study is mildly degraded by motion artifact despite repeated imaging attempts. Major intracranial vascular flow voids are preserved with a degree of intracranial artery dolichoectasia. Mildly heterogeneous diffusion weighted imaging throughout the brain. Suggestion of 1 or 2 punctate foci of restricted diffusion in the right occipital lobe (series 3, image 21 and  series 5, image 5) appears instead to correspond to artifact from chronic micro hemorrhages (see series 10, images 10 and 11). There also appears to be chronic micro hemorrhage in the opposite left occipital pole (image 10). No definite restricted diffusion elsewhere. Underlying confluent bilateral cerebral white matter T2 and FLAIR hyperintensity, mostly periventricular but extending to subcortical white matter in some areas. T2 hyperintensity in the posterior limb of the right internal capsule appears chronic. There is also evidence of a chronic lacunar infarct in the left thalamus and also at the left caudothalamic groove. No cortical encephalomalacia identified. No midline shift, mass effect, evidence of mass lesion, ventriculomegaly, extra-axial collection or acute intracranial hemorrhage. Cervicomedullary junction and pituitary are within normal limits. Negative visualized cervical spine. Visible internal auditory structures appear normal. Mastoids are clear. Paranasal sinuses are clear. Orbit and scalp soft tissues appear within normal limits. Visualized bone marrow signal is within normal limits. IMPRESSION: 1.  No acute intracranial abnormality identified. 2. Constellation of signal changes  in the brain most compatible with age advanced chronic small vessel disease, including chronic micro hemorrhages in both occipital poles. Electronically Signed: By: Genevie Ann M.D. On: 07/10/2015 16:36   Dg Chest Portable 1 View  07/10/2015  CLINICAL DATA:  54 year old current history of hypertension and diabetes, found earlier today sitting on a curb at [place] outside of her car for an unknown amount of time. Hyperglycemia and hypertension. Left-sided weakness on clinical examination. EXAM: PORTABLE CHEST 1 VIEW COMPARISON:  None. FINDINGS: Cardiac silhouette upper normal in size to slightly enlarged for AP portable technique. Lungs clear. Bronchovascular markings normal. Pulmonary vascularity normal. No visible pleural effusions. No pneumothorax. Degenerative changes throughout the thoracic spine. IMPRESSION: No acute cardiopulmonary disease. Electronically Signed   By: Evangeline Dakin M.D.   On: 07/10/2015 15:31        Scheduled Meds: . amLODipine  10 mg Oral Daily  . aspirin EC  325 mg Oral Daily  . atorvastatin  40 mg Oral q1800  . enoxaparin (LOVENOX) injection  40 mg Subcutaneous Q24H  . insulin aspart  0-15 Units Subcutaneous TID WC  . insulin aspart  0-5 Units Subcutaneous QHS  . insulin detemir  10 Units Subcutaneous Daily  . insulin starter kit- syringes  1 kit Other Once  . levETIRAcetam  500 mg Oral Daily  . metoprolol tartrate  12.5 mg Oral BID  . piperacillin-tazobactam (ZOSYN)  IV  3.375 g Intravenous 3 times per day  . vancomycin  500 mg Intravenous Q12H   Continuous Infusions:    Principal Problem:   Acute encephalopathy Active Problems:   CVA (cerebral infarction)   Left-sided weakness   Hyperglycemia   Acute kidney injury ([place])   Hyponatremia   Essential hypertension   Acute renal failure (HCC)   Acute on chronic respiratory failure with hypercapnia (HCC)   Hypertensive emergency   Lactic acidosis   Hyperglycemic hyperosmolar nonketotic coma ([place])   Acute  ischemic stroke ([place])    Time spent: 30 minutes.    Phillips Climes, MD, [facility] Pager [phone]  If 7PM-7AM, please contact night-coverage [email or web address] Password [email or web address] 07/12/2015, 9:44 AM    LOS: 2 days

## 2015-07-12 NOTE — Evaluation (Signed)
Speech Language Pathology Evaluation Patient Details Name: Kayla Donaldson MRN: [id number] DOB: September 01, 1960 Today's Date: 07/12/2015 Time: 0865-7846 SLP Time Calculation (min) (ACUTE ONLY): 31 min  Problem List:  Patient Active Problem List   Diagnosis Date Noted  . Focal seizure (HCC)   . Hyperglycemic hyperosmolar nonketotic coma (HCC)   . Acute ischemic stroke (HCC)   . CVA (cerebral infarction) 07/10/2015  . Acute encephalopathy 07/10/2015  . Left-sided weakness 07/10/2015  . Hyperglycemia 07/10/2015  . Acute kidney injury (HCC) 07/10/2015  . Hyponatremia 07/10/2015  . TIA (transient ischemic attack) 07/10/2015  . Disorientation   . Hypertension   . Essential hypertension   . Acute renal failure (HCC)   . Acute on chronic respiratory failure with hypercapnia (HCC)   . Hypertensive emergency   . Lactic acidosis    Past Medical History:  Past Medical History  Diagnosis Date  . Hypertension   . Acute encephalopathy   . Type II diabetes mellitus (HCC)    Past Surgical History: History reviewed. No pertinent past surgical history. HPI:  54 year old female admitted 12/19 for acute encephalopathy of unclear etiology. She was profoundly hypertensive with SBP 210s and hyperglycemic with glucose 600s. She was agitated and was acidotic on ABG.   Assessment / Plan / Recommendation Clinical Impression  Pt scored a 19/30 on the MoCA (below 26 indicative of impairment), with most difficulty attributable to poor sustained attention, slow processing, and working memory limiting pt's abiltiy to recall instructions for accurate task completion. Intermittent word-finding errors noted throughout conversation are likely secondary to cognitive deficits. Most significant is pt's poor awareness, with little to no insight into impairments despite explanation of testing performance. Despite Total A for intellectual and emergent awareness, she insists that she is not having any trouble. 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 Notified NP of strong recommendation for f/u SLP services and 24/7 supervision upon return home. Pt not in agreement of services at this time, as she is not able to identify her areas of deficit and therefore does not understand the need. Will continue to follow as she remains in house.    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)   Cognition  Overall Cognitive Status: Impaired/Different from baseline Arousal/Alertness: Awake/alert Orientation Level: Oriented X4 Attention: Sustained Sustained Attention: Impaired Sustained Attention Impairment: Verbal basic;Functional basic Memory: Impaired Memory Impairment: Retrieval deficit;Decreased recall of new information;Other (comment) (working  memory) Awareness: Impaired Awareness Impairment: Intellectual impairment;Emergent impairment;Anticipatory impairment Problem Solving: Impaired Problem Solving Impairment: Functional basic;Verbal complex Safety/Judgment: Impaired    Comprehension  Auditory Comprehension Overall Auditory Comprehension: Impaired Commands: Impaired Complex Commands: 50-74% accurate Interfering Components: Attention;Working Theatre manager: Within Education administrator    Expression Expression Primary Mode of Expression: Verbal Verbal Expression Overall Verbal Expression: Impaired Initiation: No impairment Level of  Generative/Spontaneous Verbalization: Conversation Repetition: No impairment Naming: Impairment Confrontation: Within functional limits Divergent: 75-100% accurate Verbal Errors: Not aware of errors;Other (comment) (word-finding errors at the conversational level) Interfering Components:  Attention Non-Verbal Means of Communication: Not applicable   Oral / Motor Oral Motor/Sensory Function Overall Oral Motor/Sensory Function: Within functional limits Motor Speech Overall Motor Speech: Appears within functional limits for tasks assessed    Maxcine HamLaura Paiewonsky, M.A. CCC-SLP [phone]  Maxcine Hamaiewonsky, Izyan Ezzell 07/12/2015, 3:21 PM

## 2015-07-13 ENCOUNTER — Inpatient Hospital Stay (HOSPITAL_COMMUNITY): Payer: Federal, State, Local not specified - PPO

## 2015-07-13 DIAGNOSIS — I639 Cerebral infarction, unspecified: Secondary | ICD-10-CM

## 2015-07-13 LAB — GLUCOSE, CAPILLARY
GLUCOSE-CAPILLARY: 200 mg/dL — AB (ref 65–99)
Glucose-Capillary: 147 mg/dL — ABNORMAL HIGH (ref 65–99)

## 2015-07-13 MED ORDER — ASPIRIN 325 MG PO TBEC: 325.0000 mg | DELAYED_RELEASE_TABLET | Freq: Every day | ORAL | Status: AC

## 2015-07-13 MED ORDER — METOPROLOL TARTRATE 25 MG PO TABS: 12.5000 mg | ORAL_TABLET | Freq: Two times a day (BID) | ORAL | Status: AC

## 2015-07-13 MED ORDER — METFORMIN HCL 500 MG PO TABS: 500.0000 mg | ORAL_TABLET | Freq: Two times a day (BID) | ORAL | Status: AC

## 2015-07-13 MED ORDER — ATORVASTATIN CALCIUM 40 MG PO TABS: 40.0000 mg | ORAL_TABLET | Freq: Every day | ORAL | Status: AC

## 2015-07-13 MED ORDER — HYDRALAZINE HCL 25 MG PO TABS: 25.0000 mg | ORAL_TABLET | Freq: Three times a day (TID) | ORAL | Status: AC

## 2015-07-13 MED ORDER — LEVETIRACETAM ER 500 MG PO TB24: 500.0000 mg | ORAL_TABLET | Freq: Every day | ORAL | Status: DC

## 2015-07-13 NOTE — Care Management Note (Signed)
Case Management Note  Patient Details  Name: Kayla Donaldson MRN: [id number] Date of Birth: 1961-03-18  Subjective/Objective:    OT and ST recommend home health follow-up, pt will also need home health RN for DM mgmt/education.  Pt agrees, [facility], liaison notified               Expected Discharge Plan:  Home w Home Health Services  Discharge planning Services  CM Consult  Post Acute Care Choice:  Home Health Choice offered to:  Patient    HH Arranged:  RN, OT, Speech Therapy [facility]  Status of Service:  Completed, signed off  Magdalene RiverMayo, Rossi Silvestro T, [place]RN 07/13/2015, 12:51 PM

## 2015-07-13 NOTE — Progress Notes (Signed)
Occupational Therapy Treatment Patient Details Name: Kayla Donaldson MRN: [id number] DOB: 1961-05-06 Today's Date: 07/13/2015    History of present illness 54 year old female admitted 12/19 for acute encephalopathy of unclear etiology. She was profoundly hypertensive with SBP 210s and hyperglycemic with glucose 600s. She was agitated and was acidotic on ABG. MRI positive for Acute right PCA territory ischemia, including involvement of the dorsal right thalamus.   OT comments  Pt performed path finding activities throughout the hospital.  She did well initially, but began making errors with difficulty recognizing errors as she fatigued.  She required mod A, overall.  Pt finally acknowledged that she may have some deficits and [facility]   Encouraged her to discuss driving recommendations with MD.    Follow Up Recommendations  Home health OT;Supervision/Assistance - 24 hour    Equipment Recommendations  None recommended by OT    Recommendations for Other Services      Precautions / Restrictions Precautions Precautions: None       Mobility Bed Mobility Overal bed mobility: Modified Independent             General bed mobility comments: use of bed rail and HOB elevated   Transfers Overall transfer level: Modified independent                    Balance                                   ADL                                         General ADL Comments: Disscussed pacing and taking rest breaks at home, and reinforced that she needs to double check herself when she fatigues - she verbalized understanding.  Also instructed her to talk with MD re: driving recommendations       Vision                 Additional Comments: Pt was able to scan maps without difficulty    Perception     Praxis      Cognition   Behavior [facility] for tasks assessed/performed Overall Cognitive Status:  Impaired/Different from baseline     Current Attention Level: Divided Memory: Decreased short-term memory  Following Commands: Follows multi-step commands consistently Safety/Judgement: Decreased awareness of deficits Awareness: Emergent Problem Solving: Difficulty sequencing;Requires verbal cues General Comments: Pt performed path finding and map reading to find locations within the hospital.  She did very well until she fatigued and then began to make errors and demonstrated difficulty self correcting errors - required min A overall for the task  Pt finally acknowleged that she may not fully be at baseline, and did agree to Home health services     Extremity/Trunk Assessment               Exercises     Shoulder Instructions       General Comments      Pertinent Vitals/ Pain       Pain Assessment: No/denies pain  Home Living  Prior Functioning/Environment              Frequency Min 2X/week     Progress Toward Goals  OT Goals(current goals can now be found in the care plan section)     ADL Goals Pt Will Perform Grooming: with modified independence;standing (2 tasks without VCs for sequencing) Pt Will Perform Upper Body Bathing: Independently (no VCs needed) Pt Will Perform Lower Body Bathing: Independently (no VCs needed) Pt Will Perform Upper Body Dressing: Independently (no VCs needed) Pt Will Perform Lower Body Dressing: Independently;sit to/from stand (no VCs needed) Pt Will Transfer to Toilet: Independently;ambulating Pt Will Perform Toileting - Clothing Manipulation and hygiene: Independently;sit to/from stand Additional ADL Goal #1: Pt will follow 2 and 3 step commands without VCs  Plan Discharge plan remains appropriate    Co-evaluation                 End of Session     Activity Tolerance Patient limited by fatigue   Patient Left in bed;with call bell/phone within reach    Nurse Communication Mobility status        Time: 1610-9604 OT Time Calculation (min): 65 min  Charges: OT General Charges $OT Visit: 1 Procedure OT Treatments $Self Care/Home Management : 53-67 mins  [name] M 07/13/2015, 12:29 PM

## 2015-07-13 NOTE — Progress Notes (Signed)
Patient educated on insulin/injection. Patient self administered insulin. Patient demonstrated/verbalized understanding.

## 2015-07-13 NOTE — Discharge Instructions (Signed)
Follow with Primary MD in 7 days  ° °Get CBC, CMP, checked  by Primary MD next visit.  ° ° °Activity: As tolerated with Full fall precautions use walker/cane & assistance as needed ° ° °Disposition Home  ° ° °Diet: Heart Healthy , carbohydrate modified , with feeding assistance and aspiration precautions. ° °For Heart failure patients - Check your Weight same time everyday, if you gain over 2 pounds, or you develop in leg swelling, experience more shortness of breath or chest pain, call your Primary MD immediately. Follow Cardiac Low Salt Diet and 1.5 lit/day fluid restriction. ° ° °On your next visit with your primary care physician please Get Medicines reviewed and adjusted. ° ° °Please request your Prim.MD to go over all Hospital Tests and Procedure/Radiological results at the follow up, please get all Hospital records sent to your Prim MD by signing hospital release before you go home. ° ° °If you experience worsening of your admission symptoms, develop shortness of breath, life threatening emergency, suicidal or homicidal thoughts you must seek medical attention immediately by calling 911 or calling your MD immediately  if symptoms less severe. ° °You Must read complete instructions/literature along with all the possible adverse reactions/side effects for all the Medicines you take and that have been prescribed to you. Take any new Medicines after you have completely understood and accpet all the possible adverse reactions/side effects.  ° °Do not drive, operating heavy machinery, perform activities at heights, swimming or participation in water activities or provide baby sitting services if your were admitted for syncope or siezures until you have seen by Primary MD or a Neurologist and advised to do so again. ° °Do not drive when taking Pain medications.  ° ° °Do not take more than prescribed Pain, Sleep and Anxiety Medications ° °Special Instructions: If you have smoked or chewed Tobacco  in the last 2 yrs  please stop smoking, stop any regular Alcohol  and or any Recreational drug use. ° °Wear Seat belts while driving. ° ° °Please note ° °You were cared for by a hospitalist during your hospital stay. If you have any questions about your discharge medications or the care you received while you were in the hospital after you are discharged, you can call the unit and asked to speak with the hospitalist on call if the hospitalist that took care of you is not available. Once you are discharged, your primary care physician will handle any further medical issues. Please note that NO REFILLS for any discharge medications will be authorized once you are discharged, as it is imperative that you return to your primary care physician (or establish a relationship with a primary care physician if you do not have one) for your aftercare needs so that they can reassess your need for medications and monitor your lab values. ° °

## 2015-07-13 NOTE — Progress Notes (Signed)
Patient given discharge instructions. Patient educated on insulin and what symptoms to watch for. Patient instructed not to drive, swim, use heavy equipment until seen by PCP. Prescriptions given to patient and instructed to take to pharmacy to get filled. Patient verbalized understanding. Patient discharged home. Patient's aunt at bedside to transport home.

## 2015-07-13 NOTE — Discharge Summary (Addendum)
Kayla Donaldson, is a 54 y.o. female  DOB 01-21-1961  MRN [id number].  Admission date:  07/10/2015  Admitting Physician  Ozella Rocksavid J Merrell, MD  Discharge Date:  07/13/2015   Primary MD  No primary care provider on file.  Recommendations for primary care physician for things to follow:  - Please check CBC, BMP during next visit. - Titrate antihypertensive medication as needed. - Follow with neurology in 4-8 weeks - Consider adding ACEI/ARB as an outpatient renal function remained stable.   Admission Diagnosis  Hyperglycemia [R73.9] CVA (cerebral infarction) [I63.9] Essential hypertension [I10]   Discharge Diagnosis  Hyperglycemia [R73.9] CVA (cerebral infarction) [I63.9] Essential hypertension [I10]    Principal Problem:   Acute encephalopathy Active Problems:   CVA (cerebral infarction)   Left-sided weakness   Hyperglycemia   Acute kidney injury (HCC)   Hyponatremia   Essential hypertension   Acute renal failure (HCC)   Acute on chronic respiratory failure with hypercapnia (HCC)   Hypertensive emergency   Lactic acidosis   Hyperglycemic hyperosmolar nonketotic coma (HCC)   Acute ischemic stroke (HCC)   Focal seizure (HCC)      Past Medical History  Diagnosis Date  . Hypertension   . Acute encephalopathy   . Type II diabetes mellitus (HCC)     History reviewed. No pertinent past surgical history.     History of present illness and  Hospital Course:     Kindly see H&P for history of present illness and admission details, please review complete Labs, Consult reports and Test reports for all details in brief  HPI  from the history and physical done on the day of admission 07/10/2015 Kayla LorenzoSaralyn Donaldson is a 54 y.o. female with a past medical history that includes hypertension, diabetes presents to the emergency department if complaint of altered mental status. Initial evaluation in  the emergency department reveals hyperglycemia, hyponatremia, acute kidney injury, sided weakness concerning for stroke.  Information is obtained from EMS and the chart as patient alert but not following commands or interactive. Portably patient [facility] and called the police. Apparently they found her car package next to it. Is uncertain [facility] or how she got there were she drove. EMS arrived and noted left-sided weakness and left-sided facial droop. In the field her blood pressure was 200/100 and her CBG was over 600.  In the emergency department she is afebrile, hypertensive and not hypoxic   Hospital Course  54 year old female patient with history of DM, HTN, noncompliant with medications, [facility]/18/15-06/23/15 for sepsis secondary to cellulitis and perivulvar abscess, poorly controlled DM & HTN, who was found down/altered mental status in front of [employer] on 12/19-unknown time of onset and presented to ED with left-sided weakness, left gaze preference, nystagmus, glucose 600, BP 237/105, CO2 71 and pH of 7.25. Neurology and CCM consulted. Workup was significant for acute CVA and seizures. Admitted to stepdown, treated with insulin drip and CBGs improved.  Mental status improved.  Acute encephalopathy -Multifactorial secondary to acute CVA , seizures, and metabolic (HONK and hypertensive emergency-both improved), unlikely infectious given negative work up. -  resolved. - RPR unreactive, HIV antibody nonreactive, blood cultures 2 to date.. UDS yet to be sent.   Poorly controlled type II DM with HONK - No DKA in the absence of ketones in her urine. - Treated with aggressive IV fluids and IV insulin drip. CBGs have improved in the 100-184 mg per DL range. - Patient seems to be noncompliant with medications and has several reasons. She states that when she was discharged from [facility] - she was  not educated to give herself insulin's and hence has not been taking same. Also not sure if she's been taking her tablets. She was supposed to be on Levemir 10 units daily and metformin 500 MG twice a day. - A1c: 14.2 (glucose: 652) suggesting very poor control. - Started her back on Levemir 10 units daily. And metformin, given prescription for metformin, reports she has insulin supply at home. - Diabetes coordinator consulted for all aspects of diabetes education.   Hypertensive emergency - Initial blood pressure in the emergency department: 246/149 mmHg. - Treated with when necessary IV hydralazine and labetalol. Improved.  - Noncompliant with medications. She was supposed to be on amlodipine 10 MG daily, metoprolol 6.25 MG twice a day (could not break pill into this unusally small dose). - Start her back on amlodipine 10 MG daily and metoprolol 12.5 MG twice a day on discharge, started on hydralazine as well. - She would benefit from being on a ACEI/ARB given her history of diabetes , may be considered  as outpatient, if renal function remained stable.  Acute CVA - MRI brain with evidence of small right thalamic infarct, started on full dose aspirin, statin, antihypertensive medication has been adjusted for better control. - Carotid Dopplers 1-39% internal carotid artery stenosis bilaterally. Vertebral arteries are patent with antegrade flow - SLP/OT/RN has been arranged by case management  Seizures - EEG with evidence of seizures arising from right, likely temporal region , Managed by neurology,started on Keppra. - Patient was instructed not to drive, or swim until she cleared by her neurologist  Acute on chronic hypercapnic respiratory failure/very mild anion gap metabolic acidosis - Likely precipitated by metabolic derangement and altered mental status. - Did not require BiPAP or intubation. - CCM consultation and follow-up appreciated. - Wean off of oxygen as tolerated to keep  saturations >92%. - Resolved  Acute kidney injury - Secondary to dehydration from hyperglycemia. Resolved.  Lactic acidosis  Hypokalemia/hypomagnesemia - Repleted,   Leukocytosis - Unclear etiology. May be related to acute stress. - Chest x-ray negative. Urine microscopy not suspicious for UTI. Blood Cultures remain negative, IV antibiotics stopped 24 hours prior to discharge  Anemia  Minimally elevated troponin - No reported chest pain. Probably stress response, flat troponin trend. 2-D echo with EF 60-65%, with no regional wall motion abnormality.   Hyperlipidemia - LDL 203. Started on Lipitor     Discharge Condition:  Stable   Follow UP  Follow-up Information    Follow up with [facility]. Schedule an appointment as soon as possible for a visit in 1 week.   Specialty:  Family Medicine   Contact information:   [address] [place] [address] [phone]      Discharge Instructions  and  Discharge Medications         Discharge Instructions  Ambulatory referral to Neurology    Complete by:  As directed   An appointment is requested in approximately: 4 weeks     Discharge instructions    Complete by:  As directed   Follow with Primary MD  in 7 days   Get CBC, CMP, 2 view Chest X ray checked  by Primary MD next visit.    Activity: As tolerated with Full fall precautions use walker/cane & assistance as needed   Disposition Home    Diet: Heart Healthy , carbohydrate modified , with feeding assistance and aspiration precautions.  For Heart failure patients - Check your Weight same time everyday, if you gain over 2 pounds, or you develop in leg swelling, experience more shortness of breath or chest pain, call your Primary MD immediately. Follow Cardiac Low Salt Diet and 1.5 lit/day fluid restriction.   On your next visit with your primary care physician please Get Medicines reviewed and adjusted.   Please  request your Prim.MD to go over all Hospital Tests and Procedure/Radiological results at the follow up, please get all Hospital records sent to your Prim MD by signing hospital release before you go home.   If you experience worsening of your admission symptoms, develop shortness of breath, life threatening emergency, suicidal or homicidal thoughts you must seek medical attention immediately by calling 911 or calling your MD immediately  if symptoms less severe.  You Must read complete instructions/literature along with all the possible adverse reactions/side effects for all the Medicines you take and that have been prescribed to you. Take any new Medicines after you have completely understood and accpet all the possible adverse reactions/side effects.   Do not drive, operating heavy machinery, perform activities at heights, swimming or participation in water activities or provide baby sitting services if your were admitted for syncope or siezures until you have seen by Primary MD or a Neurologist and advised to do so again.  Do not drive when taking Pain medications.    Do not take more than prescribed Pain, Sleep and Anxiety Medications  Special Instructions: If you have smoked or chewed Tobacco  in the last 2 yrs please stop smoking, stop any regular Alcohol  and or any Recreational drug use.  Wear Seat belts while driving.   Please note  You were cared for by a hospitalist during your hospital stay. If you have any questions about your discharge medications or the care you received while you were in the hospital after you are discharged, you can call the unit and asked to speak with the hospitalist on call if the hospitalist that took care of you is not available. Once you are discharged, your primary care physician will handle any further medical issues. Please note that NO REFILLS for any discharge medications will be authorized once you are discharged, as it is imperative that you return  to your primary care physician (or establish a relationship with a primary care physician if you do not have one) for your aftercare needs so that they can reassess your need for medications and monitor your lab values.     Increase activity slowly    Complete by:  As directed             Medication List    TAKE these medications        amLODipine 5 MG tablet  Commonly known as:  NORVASC  Take 5 mg by mouth daily.     aspirin 325 MG EC tablet  Take 1 tablet (325 mg total) by mouth daily.     atorvastatin 40 MG tablet  Commonly known as:  LIPITOR  Take 1 tablet (40 mg total) by mouth daily at 6 PM.     hydrALAZINE 25 MG tablet  Commonly known as:  APRESOLINE  Take 1 tablet (25 mg total) by mouth 3 (three) times daily.     insulin detemir 100 UNIT/ML injection  Commonly known as:  LEVEMIR  Inject 10 Units into the skin every evening.     levETIRAcetam 500 MG 24 hr tablet  Commonly known as:  KEPPRA XR  Take 1 tablet (500 mg total) by mouth daily.     metFORMIN 500 MG tablet  Commonly known as:  GLUCOPHAGE  Take 1 tablet (500 mg total) by mouth 2 (two) times daily.     metoprolol tartrate 25 MG tablet  Commonly known as:  LOPRESSOR  Take 0.5 tablets (12.5 mg total) by mouth 2 (two) times daily.          Diet and Activity recommendation: See Discharge Instructions above   Consults obtained -  Neurology   Major procedures and Radiology Reports - PLEASE review detailed and final reports for all details, in brief -      Ct Head Wo Contrast  07/10/2015  CLINICAL DATA:  Found unresponsive at [employer], complains of frontal headache EXAM: CT HEAD WITHOUT CONTRAST TECHNIQUE: Contiguous axial images were obtained from the base of the skull through the vertex without intravenous contrast. COMPARISON:  None. FINDINGS: Mild diffuse cortical atrophy. Moderately prominent low attenuation throughout the periventricular white matter somewhat advanced for patient age. Mild  age-related left basal ganglia calcification. No evidence of large vascular territory infarct. No hemorrhage or extra-axial fluid. No hydrocephalus. Calvarium is intact. No significant inflammatory change in the visualized portions of paranasal sinuses or mastoid air cells. IMPRESSION: Somewhat advanced involutional change for age but otherwise negative for acute abnormality. Electronically Signed   By: Esperanza Heir M.D.   On: 07/10/2015 14:58   Mr Brain Wo Contrast (neuro Protocol)  07/11/2015  ADDENDUM REPORT: 07/11/2015 09:16 ADDENDUM: This exam was reviewed in person with the clinical service by Dr. Karin Golden on the morning of 07/11/2015. They are strongly suspecting acute stroke syndrome. On review of this exam there is increased trace diffusion signal in the dorsal and medial right thalamus (series 3, image 26) compatible with acute thalamic lacunar infarct. This raises the possibility that the small right PCA territory foci of diffusion then are also related to acute right PCA territory ischemia rather than hemosiderin susceptibility as was initially suspected. CONCLUSION: Acute right PCA territory ischemia, including involvement of the dorsal right thalamus. No associated mass effect or acute hemorrhage. Electronically Signed   By: Odessa Fleming M.D.   On: 07/11/2015 09:16  07/11/2015  CLINICAL DATA:  54 year old female found with altered mental status at [employer]. Elevated blood pressure. Not following commands. Initial encounter. EXAM: MRI HEAD WITHOUT CONTRAST TECHNIQUE: Multiplanar, multiecho pulse sequences of the brain and surrounding structures were obtained without intravenous contrast. COMPARISON:  Head CT without contrast 1444 hours today. FINDINGS: Study is mildly degraded by motion artifact despite repeated imaging attempts. Major intracranial vascular flow voids are preserved with a degree of intracranial artery dolichoectasia. Mildly heterogeneous diffusion weighted imaging throughout the  brain. Suggestion of 1 or 2 punctate foci of restricted diffusion in the right occipital lobe (series 3, image 21 and series 5, image 5) appears instead to correspond to artifact from  chronic micro hemorrhages (see series 10, images 10 and 11). There also appears to be chronic micro hemorrhage in the opposite left occipital pole (image 10). No definite restricted diffusion elsewhere. Underlying confluent bilateral cerebral white matter T2 and FLAIR hyperintensity, mostly periventricular but extending to subcortical white matter in some areas. T2 hyperintensity in the posterior limb of the right internal capsule appears chronic. There is also evidence of a chronic lacunar infarct in the left thalamus and also at the left caudothalamic groove. No cortical encephalomalacia identified. No midline shift, mass effect, evidence of mass lesion, ventriculomegaly, extra-axial collection or acute intracranial hemorrhage. Cervicomedullary junction and pituitary are within normal limits. Negative visualized cervical spine. Visible internal auditory structures appear normal. Mastoids are clear. Paranasal sinuses are clear. Orbit and scalp soft tissues appear within normal limits. Visualized bone marrow signal is within normal limits. IMPRESSION: 1.  No acute intracranial abnormality identified. 2. Constellation of signal changes in the brain most compatible with age advanced chronic small vessel disease, including chronic micro hemorrhages in both occipital poles. Electronically Signed: By: Odessa Fleming M.D. On: 07/10/2015 16:36   Dg Chest Portable 1 View  07/10/2015  CLINICAL DATA:  54 year old current history of hypertension and diabetes, found earlier today sitting on a curb at [employer] outside of her car for an unknown amount of time. Hyperglycemia and hypertension. Left-sided weakness on clinical examination. EXAM: PORTABLE CHEST 1 VIEW COMPARISON:  None. FINDINGS: Cardiac silhouette upper normal in size to slightly enlarged  for AP portable technique. Lungs clear. Bronchovascular markings normal. Pulmonary vascularity normal. No visible pleural effusions. No pneumothorax. Degenerative changes throughout the thoracic spine. IMPRESSION: No acute cardiopulmonary disease. Electronically Signed   By: Hulan Saas M.D.   On: 07/10/2015 15:31    Micro Results     Recent Results (from the past 240 hour(s))  MRSA PCR Screening     Status: None   Collection Time: 07/10/15  6:15 PM  Result Value Ref Range Status   MRSA by PCR NEGATIVE NEGATIVE Final    Comment:        The GeneXpert MRSA Assay (FDA approved for NASAL specimens only), is one component of a comprehensive MRSA colonization surveillance program. It is not intended to diagnose MRSA infection nor to guide or monitor treatment for MRSA infections.   Culture, blood (Routine X 2) w Reflex to ID Panel     Status: None (Preliminary result)   Collection Time: 07/10/15  8:50 PM  Result Value Ref Range Status   Specimen Description BLOOD RIGHT ARM  Final   Special Requests IN PEDIATRIC BOTTLE 1CC  Final   Culture NO GROWTH 2 DAYS  Final   Report Status PENDING  Incomplete  Culture, blood (Routine X 2) w Reflex to ID Panel     Status: None (Preliminary result)   Collection Time: 07/10/15  8:55 PM  Result Value Ref Range Status   Specimen Description BLOOD RIGHT HAND  Final   Special Requests IN PEDIATRIC BOTTLE 2CC  Final   Culture NO GROWTH 2 DAYS  Final   Report Status PENDING  Incomplete       Today   Subjective:   Kayla Donaldson today has no headache,no chest abdominal pain,no new weakness tingling or numbness, feels much better wants to go home today.   Objective:   Blood pressure 148/74, pulse 95, temperature 97.1 F (36.2 C), temperature source Oral, resp. rate 18, height 5\' 2"  (1.575 m), weight 63.2 kg (139 lb 5.3 oz), SpO2  99 %.   Intake/Output Summary (Last 24 hours) at 07/13/15 1442 Last data filed at 07/13/15 1300  Gross per  24 hour  Intake    240 ml  Output    775 ml  Net   -535 ml    Exam Awake Alert, Oriented x 3, No new F.N deficits, Normal affect [place].AT,PERRAL Supple Neck,No JVD, No cervical lymphadenopathy appriciated.  Symmetrical Chest wall movement, Good air movement bilaterally, CTAB RRR,No Gallops,Rubs or new Murmurs, No Parasternal Heave +ve B.Sounds, Abd Soft, Non tender, No organomegaly appriciated, No rebound -guarding or rigidity. No Cyanosis, Clubbing or edema, No new Rash or bruise  Data Review   CBC w Diff:  Lab Results  Component Value Date   WBC 11.2* 07/11/2015   HGB 10.2* 07/11/2015   HCT 31.2* 07/11/2015   PLT 224 07/11/2015   LYMPHOPCT 13 07/11/2015   MONOPCT 7 07/11/2015   EOSPCT 0 07/11/2015   BASOPCT 0 07/11/2015    CMP:  Lab Results  Component Value Date   NA 142 07/12/2015   K 3.3* 07/12/2015   CL 107 07/12/2015   CO2 27 07/12/2015   BUN 7 07/12/2015   CREATININE 0.85 07/12/2015   PROT 6.6 07/11/2015   ALBUMIN 3.1* 07/11/2015   BILITOT 0.5 07/11/2015   ALKPHOS 75 07/11/2015   AST 18 07/11/2015   ALT 10* 07/11/2015  .   Total Time in preparing paper work, data evaluation and todays exam - 35 minutes  Clydene Burack M.D on 07/13/2015 at 2:42 PM  [facility]   Office  [phone]

## 2015-07-13 NOTE — Progress Notes (Signed)
*  PRELIMINARY RESULTS* Vascular Ultrasound Carotid Duplex (Doppler) has been completed.  Findings suggest 1-39% internal carotid artery stenosis bilaterally. Vertebral arteries are patent with antegrade flow.  Transcranial doppler has been completed.  07/13/2015 2:09 PM Gertie FeyMichelle Shalia Bartko, RVT, RDCS, RDMS

## 2015-07-15 LAB — CULTURE, BLOOD (ROUTINE X 2)
CULTURE: NO GROWTH
Culture: NO GROWTH

## 2015-07-27 ENCOUNTER — Telehealth: Payer: Self-pay | Admitting: Neurology

## 2015-07-27 NOTE — Telephone Encounter (Signed)
Pt called sts she needs letter from Dr Pearlean BrownieSethi releasing her to drive and go back to work. I explained to pt that [facility] by Dr Pearlean BrownieSethi and he could not give her a letter. Pt raised her voice and demanded she get a letter and that she was on her way to [facility]. I was giving pt directions as she was adamant she was coming and she said she had someone driving for her but I never heard another's persons voice as she was having to tell the directions. I became concerned she was driving and asked Charlotte SanesSavannah if she would go to front parking lot to see for herself. Charlotte SanesSavannah said she saw the car pull in, park and pt get out of drivers seat. Savannah verified it was correct pt from check-in staff after pt came in.

## 2015-07-27 NOTE — Telephone Encounter (Signed)
Rn talk to patient about needing a letter to drive. Rn met with the patient in the waiting room. Rn explain that Dr.Sethi cannot write her a letter to drive until she has a follow up appt. Pt has never been seen at [facility] and will be a hospital follow up. Rn also stated the referral staff has tried to schedule her for a follow up, but the vm was full. Rn stated the referral schedule her for 1030 tomorrow for a new pt hospital follow up. Rn gave pt the appt reminder. PT became very angry that she cannot see Dr.Sethi. Rn explain to patient that per Dr.Sethi he can evaluate her and discuss the letter and her follow up on Friday at appt. Pt than request to see the head md or supervisor. Megan(supervisor) and Larene Beach met with patient because she refuse to leave until she got got another answer about driving. Jinny Blossom finally talk to patient that the letter, and driving issues can be discuss with md on Friday. Pt finally left office and stated she will be at appt today.

## 2015-07-28 ENCOUNTER — Telehealth: Payer: Self-pay | Admitting: Neurology

## 2015-07-28 ENCOUNTER — Ambulatory Visit (INDEPENDENT_AMBULATORY_CARE_PROVIDER_SITE_OTHER): Payer: Federal, State, Local not specified - PPO | Admitting: Neurology

## 2015-07-28 ENCOUNTER — Encounter: Payer: Self-pay | Admitting: Neurology

## 2015-07-28 VITALS — BP 190/100 | HR 70 | Ht 61.0 in | Wt 139.0 lb

## 2015-07-28 DIAGNOSIS — R569 Unspecified convulsions: Secondary | ICD-10-CM | POA: Diagnosis not present

## 2015-07-28 MED ORDER — LEVETIRACETAM ER 500 MG PO TB24: 500.0000 mg | ORAL_TABLET | Freq: Every day | ORAL | Status: AC

## 2015-07-28 NOTE — Progress Notes (Signed)
Patient was seen by Dr.Sethi for office follow up. Patient was given a letter sign by Dr.Sethi to not drive till March 09,6283 because of her seizure 07-10-15. Pt verbalized understanding. Pt showed behavior issues on 07-27-15 when she met with Megan(NP). If patient exhibits any other behavior issues, pt will be dismiss based on Dr.Sethi recommendation.

## 2015-07-28 NOTE — Progress Notes (Signed)
[facility] [address] [place]. [place] [id number] [phone]       OFFICE FOLLOW-UP NOTE  Kayla. Kayla Donaldson Date of Birth:  [birth year] Medical Record Number:  [id number]   HPI: Kayla Donaldson is a 55 year old African American lady seen today for first office follow-up visit following hospital admission for confusion and left-sided weakness in the setting of severe metabolic derangement in October 2016. Kayla Donaldson is an 55 y.o. female with hx of DM,HTN who was found down in front of [employer], unknown time of onset presented with L sided weakness, L gaze preference , nystagmus, glucose of 600, BP of 237/105 and CO2 of 71 and Ph 7.25 Last known well unable to determine. Patient was not administered TPA secondary to unknown time of onset. She was admitted for further evaluation and treatment. The patient had rapid improvement in mental status as well as left-sided weakness after admission and correction of metabolic parameters and control of blood pressure. She had EEG which showed brief electrographic seizures arising from the right anterior temporal region. MRI scan of the brain showed weak diffusion abnormality in the right thalamus as well as right right occipital region which was read as possible infarct but by my opinion likely represented post ictal MRI abnormalities. Patient was started on Keppra for seizure prophylaxis. She made progressive improvement in her discharge home. She states she's had no further seizure episodes. She is tolerating Keppra 500 mg daily without side effects. She she states her blood pressure is better but is yet elevated today at 190/100. She plans to change her primary physician. Patient has been driving despite being told not to drive. He states her sugars are also better control. She is taking aspirin daily and tolerating it well.  ROS:   14 system review of systems is positive for seizures, confusion, weakness, memory loss and all other systems  negative  PMH:  Past Medical History  Diagnosis Date  . Hypertension   . Acute encephalopathy   . Type II diabetes mellitus (HCC)   . Stroke (HCC)   . Seizures (HCC)     Social History:  Social History   Social History  . Marital Status: Single    Spouse Name: N/A  . Number of Children: N/A  . Years of Education: N/A   Occupational History  . Not on file.   Social History Main Topics  . Smoking status: Never Smoker   . Smokeless tobacco: Never Used  . Alcohol Use: No  . Drug Use: No  . Sexual Activity: No   Other Topics Concern  . Not on file   Social History Narrative    Medications:   Current Outpatient Prescriptions on File Prior to Visit  Medication Sig Dispense Refill  . amLODipine (NORVASC) 5 MG tablet Take 5 mg by mouth daily.    Marland Kitchen. aspirin EC 325 MG EC tablet Take 1 tablet (325 mg total) by mouth daily. 30 tablet 0  . hydrALAZINE (APRESOLINE) 25 MG tablet Take 1 tablet (25 mg total) by mouth 3 (three) times daily. 90 tablet 0  . insulin detemir (LEVEMIR) 100 UNIT/ML injection Inject 10 Units into the skin every evening.    . metFORMIN (GLUCOPHAGE) 500 MG tablet Take 1 tablet (500 mg total) by mouth 2 (two) times daily. 60 tablet 0  . metoprolol tartrate (LOPRESSOR) 25 MG tablet Take 0.5 tablets (12.5 mg total) by mouth 2 (two) times daily. 60 tablet 0  . atorvastatin (LIPITOR) 40 MG tablet Take 1  tablet (40 mg total) by mouth daily at 6 PM. (Patient not taking: Reported on 07/28/2015) 30 tablet 0   No current facility-administered medications on file prior to visit.    Allergies:  No Known Allergies  Physical Exam General: Frail petite middle-aged African-American lady, seated, in no evident distress Head: head normocephalic and atraumatic.  Neck: supple with no carotid or supraclavicular bruits Cardiovascular: regular rate and rhythm, no murmurs Musculoskeletal: no deformity Skin:  no rash/petichiae Vascular:  Normal pulses all extremities Filed  Vitals:   07/28/15 1023  BP: 190/100  Pulse: 70   Neurologic Exam Mental Status: Awake and fully alert. Oriented to place and time. Recent and remote memory intact. Attention span, concentration and fund of knowledge appropriate. Mood and affect appropriate.  Cranial Nerves: Fundoscopic exam reveals sharp disc margins. Pupils equal, briskly reactive to light. Extraocular movements full without nystagmus. Visual fields full to confrontation. Hearing intact. Facial sensation intact. Face, tongue, palate moves normally and symmetrically.  Motor: Normal bulk and tone. Normal strength in all tested extremity muscles. Sensory.: intact to touch ,pinprick .position and vibratory sensation.  Coordination: Rapid alternating movements normal in all extremities. Finger-to-nose and heel-to-shin performed accurately bilaterally. Gait and Station: Arises from chair without difficulty. Stance is normal. Gait demonstrates normal stride length and balance . Able to heel, toe and tandem walk without difficulty.  Reflexes: 1+ and symmetric. Toes downgoing.       ASSESSMENT: 54 year transient left-sided weakness, left gaze preference for the confusion in the setting of significantly elevated blood glucose of 600 and blood pressure of 237/105 and PCO2 of 71 likely due to seizures triggered by metabolic encephalopathy. Primary right posterior cerebral artery ischemia is less likely. MRI scan of the brain showed weak diffusion abnormalities in the right thalamus and parieto-occipital region however EEG subsequently showed electrographic seizures arising from the right temporal lobe    PLAN: I had a long d/w patient about her recent stroke and seizure, risk for recurrent stroke/TIAs, seizures, personally independently reviewed imaging studies and stroke evaluation results and answered questions.Continue aspirin 325 mg daily  for secondary stroke prevention and maintain strict control of hypertension with blood  pressure goal below 130/90, diabetes with hemoglobin A1c goal below 6.5% and lipids with LDL cholesterol goal below 70 mg/dL. I also advised the patient to eat a healthy diet with plenty of whole grains, cereals, fruits and vegetables, exercise regularly and maintain ideal body weight. She was counseled to be compliant with taking Keppra X500 milligrams daily for seizure prevention. Check EEG for follow-up. She was advised not to drive for the next 3 months as per [facility].Greater than 50% of time during this 25 minute visit was spent on counseling,explanation of diagnosis, planning of further management, discussion with patient and family and coordination of care  Followup in the future with Latrelle Dodrill nurse practitioner in 3 months or call earlier if necessary.  Delia Heady, MD Note: This document was prepared with digital dictation and possible smart phrase technology. Any transcriptional errors that result from this process are unintentional

## 2015-07-28 NOTE — Patient Instructions (Signed)
I had a long d/w patient about her recent stroke and seizure, risk for recurrent stroke/TIAs, seizures, personally independently reviewed imaging studies and stroke evaluation results and answered questions.Continue aspirin 325 mg daily  for secondary stroke prevention and maintain strict control of hypertension with blood pressure goal below 130/90, diabetes with hemoglobin A1c goal below 6.5% and lipids with LDL cholesterol goal below 70 mg/dL. I also advised the patient to eat a healthy diet with plenty of whole grains, cereals, fruits and vegetables, exercise regularly and maintain ideal body weight. She was counseled to be compliant with taking Keppra X500 milligrams daily for seizure prevention. Check EEG for follow-up. She was advised not to drive for the next 3 months as per [place] [facility]. Followup in the future with Latrelle Dodrillaroline Martin nurse practitioner in 3 months or call earlier if necessary. Seizure, Adult A seizure is abnormal electrical activity in the brain. Seizures usually last from 30 seconds to 2 minutes. There are various types of seizures. Before a seizure, you may have a warning sensation (aura) that a seizure is about to occur. An aura may include the following symptoms:   Fear or anxiety.  Nausea.  Feeling like the room is spinning (vertigo).  Vision changes, such as seeing flashing lights or spots. Common symptoms during a seizure include:  A change in attention or behavior (altered mental status).  Convulsions with rhythmic jerking movements.  Drooling.  Rapid eye movements.  Grunting.  Loss of bladder and bowel control.  Bitter taste in the mouth.  Tongue biting. After a seizure, you may feel confused and sleepy. You may also have an injury resulting from convulsions during the seizure. HOME CARE INSTRUCTIONS   If you are given medicines, take them exactly as prescribed by your health care provider.  Keep all follow-up appointments as directed by your  health care provider.  Do not swim or drive or engage in risky activity during which a seizure could cause further injury to you or others until your health care provider says it is OK.  Get adequate rest.  Teach friends and family what to do if you have a seizure. They should:  Lay you on the ground to prevent a fall.  Put a cushion under your head.  Loosen any tight clothing around your neck.  Turn you on your side. If vomiting occurs, this helps keep your airway clear.  Stay with you until you recover.  Know whether or not you need emergency care. SEEK IMMEDIATE MEDICAL CARE IF:  The seizure lasts longer than 5 minutes.  The seizure is severe or you do not wake up immediately after the seizure.  You have an altered mental status after the seizure.  You are having more frequent or worsening seizures. Someone should drive you to the emergency department or call local emergency services (911 in [place] MAKE SURE YOU:  Understand these instructions.  Will watch your condition.  Will get help right away if you are not doing well or get worse.   This information is not intended to replace advice given to you by your health care provider. Make sure you discuss any questions you have with your health care provider.   Document Released: 07/05/2000 Document Revised: 07/29/2014 Document Reviewed: 02/17/2013 Elsevier Interactive Patient Education [employer]

## 2015-07-28 NOTE — Telephone Encounter (Signed)
Called pt to schedule EEG and 3 month f/u with Eber Jonesarolyn because she left without checking out.  Voicemail was full.

## 2015-08-01 ENCOUNTER — Other Ambulatory Visit: Payer: Self-pay

## 2015-08-01 DIAGNOSIS — R569 Unspecified convulsions: Secondary | ICD-10-CM

## 2015-08-01 NOTE — Telephone Encounter (Signed)
Pt finally called back to schedule EEG.  I had previously removed the EEG as "unable to contact" due to multiple failed attempts to reach the pt.  The order needs to be put back in so we can attach it to the visit.

## 2015-08-01 NOTE — Telephone Encounter (Signed)
Order is in for EEG.

## 2015-08-04 ENCOUNTER — Ambulatory Visit (INDEPENDENT_AMBULATORY_CARE_PROVIDER_SITE_OTHER): Payer: Self-pay

## 2015-08-04 DIAGNOSIS — Z0289 Encounter for other administrative examinations: Secondary | ICD-10-CM

## 2015-08-04 DIAGNOSIS — R569 Unspecified convulsions: Secondary | ICD-10-CM

## 2015-08-11 ENCOUNTER — Other Ambulatory Visit: Payer: Federal, State, Local not specified - PPO

## 2017-04-25 IMAGING — MR MR HEAD W/O CM
8 of 10 series · 34 of 48 positions shown · non-contrast
Comparison: Head CT without contrast 9888 hours today.

ADDENDUM:
This exam was reviewed in person with the clinical service by Dr.
Nazareth on the morning of 07/11/2015. They are strongly suspecting
acute stroke syndrome.

On review of this exam there is increased trace diffusion signal in
the dorsal and medial right thalamus (series 3, image 26) compatible
with acute thalamic lacunar infarct. This raises the possibility
that the small right PCA territory foci of diffusion then are also
related to acute right PCA territory ischemia rather than
hemosiderin susceptibility as was initially suspected.
CLINICAL DATA: 54-year-old female found with altered mental status
at [facility]. Elevated blood pressure. Not following commands.
Initial encounter.
EXAM:
MRI HEAD WITHOUT CONTRAST
TECHNIQUE: Multiplanar, multiecho pulse sequences of the brain and surrounding
structures were obtained without intravenous contrast.

[Series 3: DWI · axial · 3.0mm · 1.09mm/px · z∈[-76,+67]mm · 8 of 98 slices shown (1 of 4)]
[im 1/98]
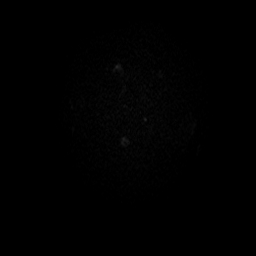
[im 11/98]
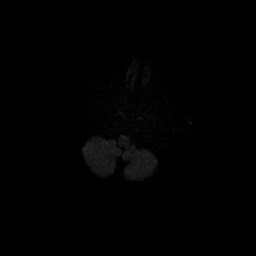
[im 33/98]
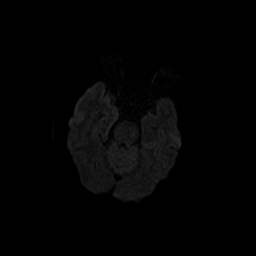
[im 44/98]
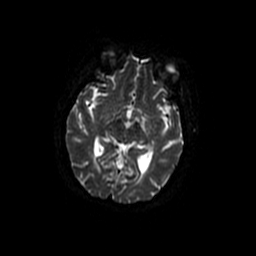
[im 54/98]
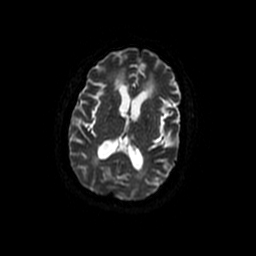
[im 65/98]
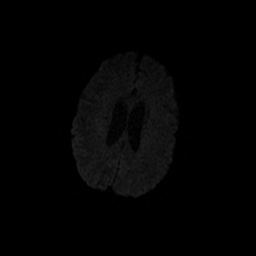
[im 87/98]
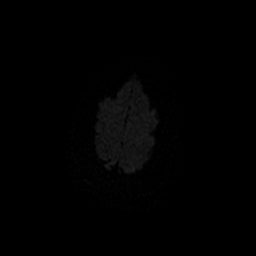
[im 98/98]
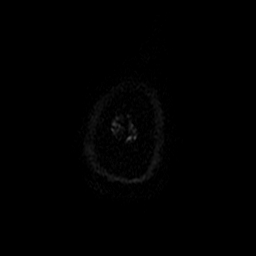

[Series 4: T2 · axial · 5.0mm · 0.43mm/px · z∈[-71,+71]mm · 3 of 25 slices shown (1 of 2)]
[im 1/25]
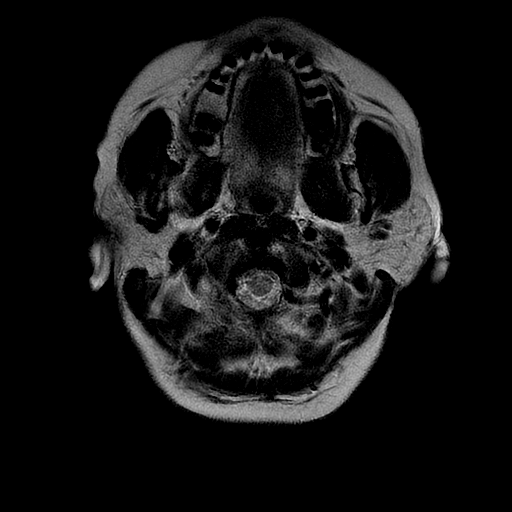
[im 13/25]
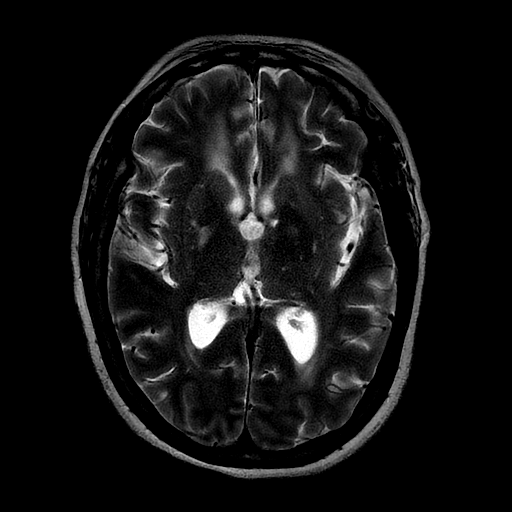
[im 25/25]
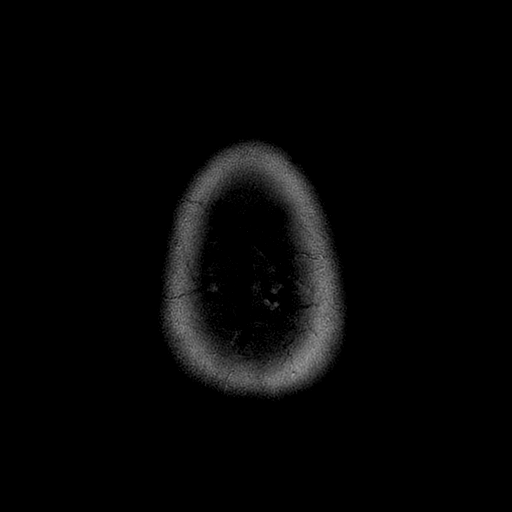

[Series 5: DWI · coronal · 5.0mm · 1.09mm/px · 7 of 68 slices shown (2 of 4)]
[im 1/68]
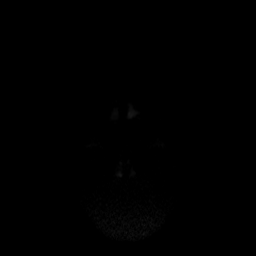
[im 12/68]
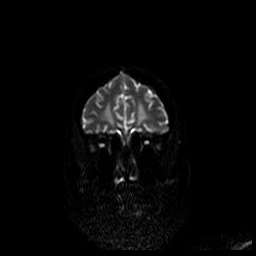
[im 23/68]
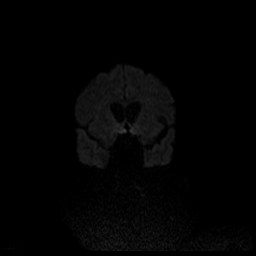
[im 34/68]
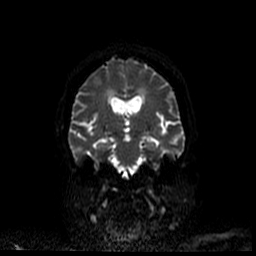
[im 45/68]
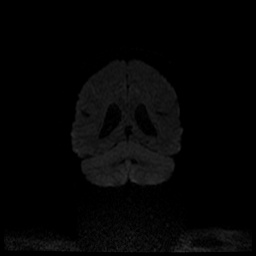
[im 56/68]
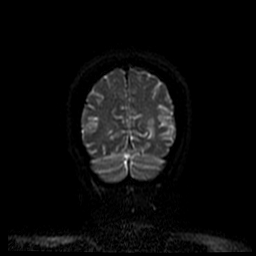
[im 68/68]
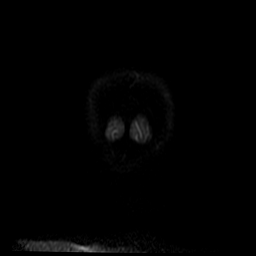

[Series 6: FLAIR · axial · 5.0mm · 0.43mm/px · z∈[-71,+71]mm · 3 of 25 slices shown (1 of 2)]
[im 1/25]
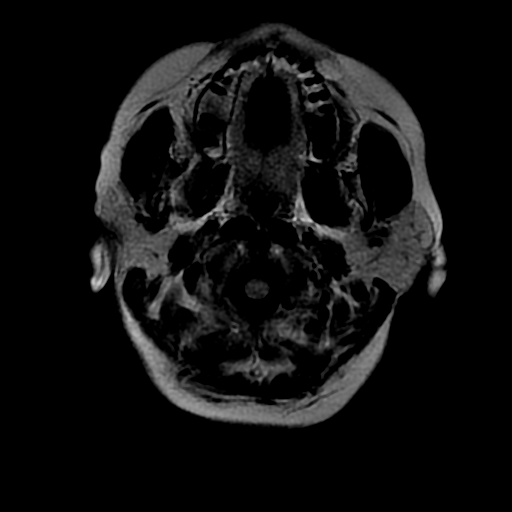
[im 13/25]
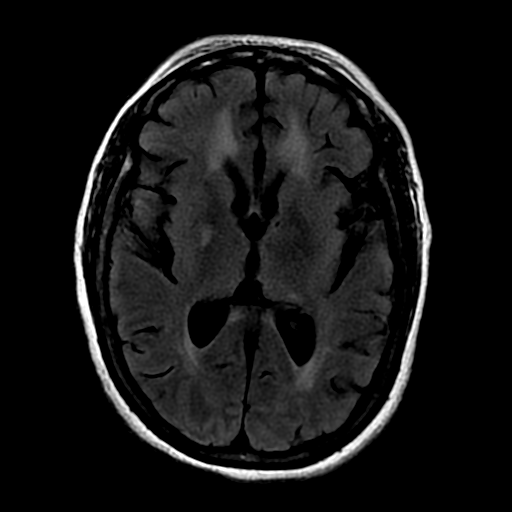
[im 25/25]
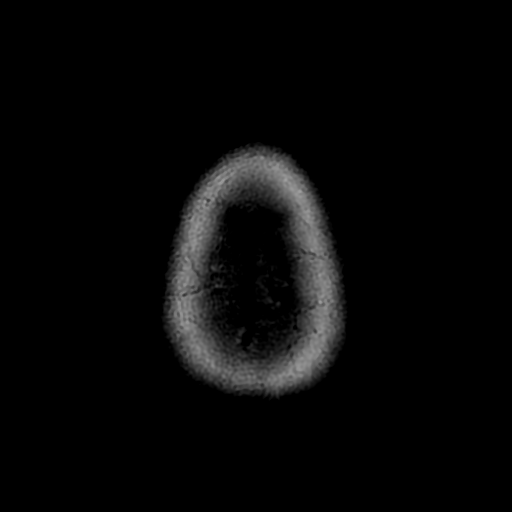

[Series 11: T2 · coronal · 5.0mm · 0.43mm/px · 3 of 29 slices shown (2 of 2)]
[im 1/29]
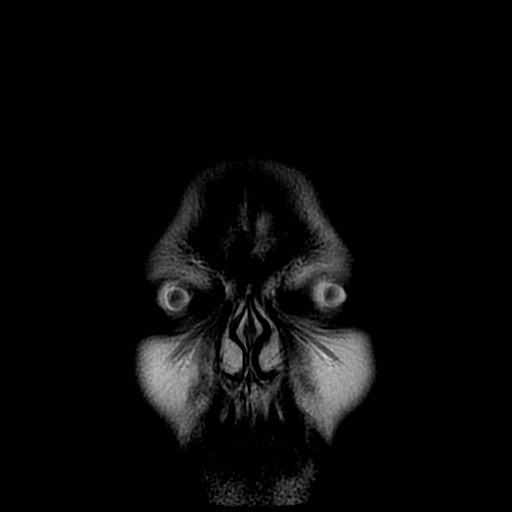
[im 15/29]
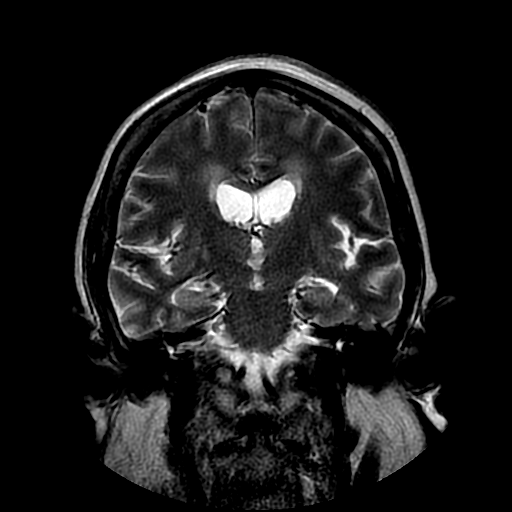
[im 29/29]
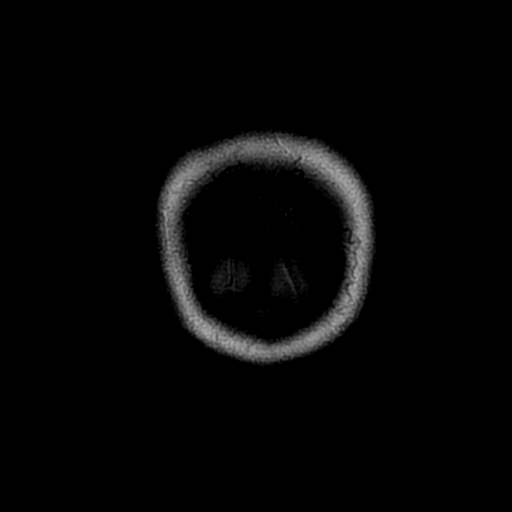

[Series 12: FLAIR · sagittal · 5.0mm · 0.47mm/px · 2 of 23 slices shown (2 of 2)]
[im 1/23]
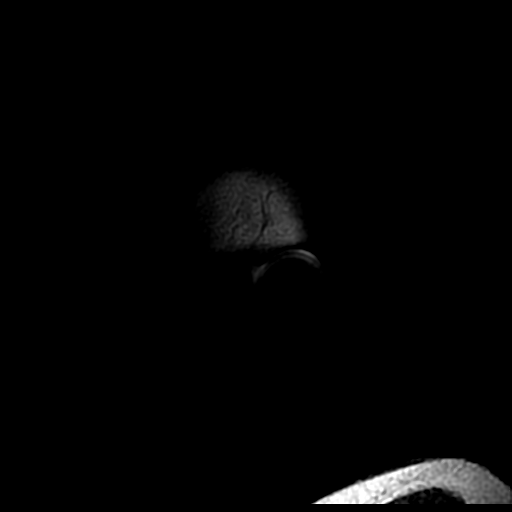
[im 23/23]
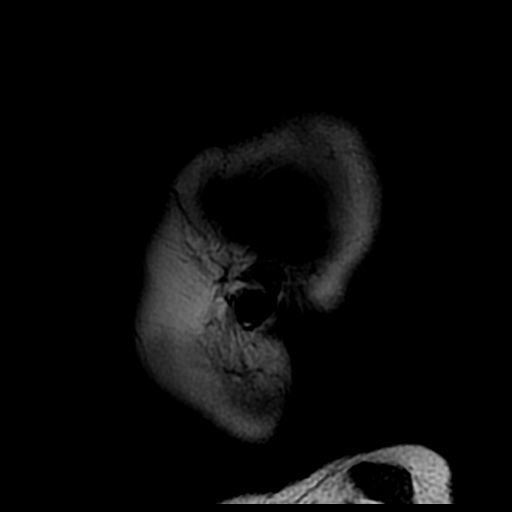

[Series 300: DWI · axial · 3.0mm · 1.09mm/px · z∈[-76,+67]mm · 5 of 49 slices shown (3 of 4)]
[im 1/49]
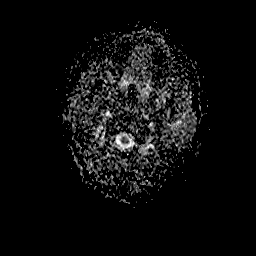
[im 13/49]
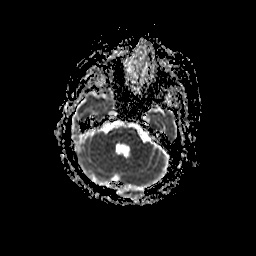
[im 25/49]
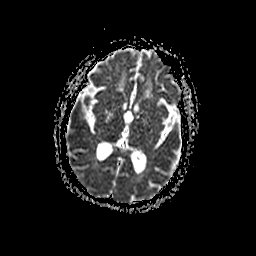
[im 37/49]
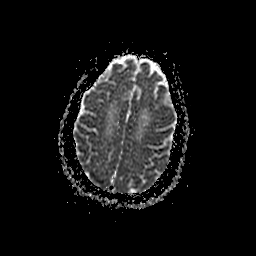
[im 49/49]
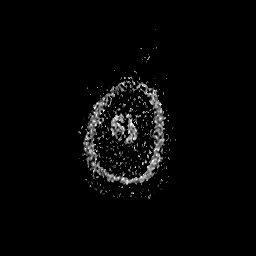

[Series 500: DWI · coronal · 5.0mm · 1.09mm/px · 3 of 34 slices shown (4 of 4)]
[im 1/34]
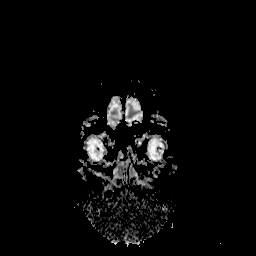
[im 17/34]
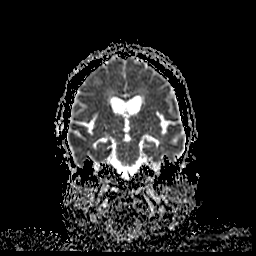
[im 34/34]
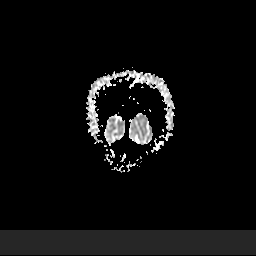

[34 of 48 positions shown; findings below may reference images not displayed]

CONCLUSION: Acute right PCA territory ischemia, including involvement of the
dorsal right thalamus. No associated mass effect or acute
hemorrhage.
FINDINGS: Study is mildly degraded by motion artifact despite repeated imaging
attempts.

Major intracranial vascular flow voids are preserved with a degree
of intracranial artery dolichoectasia.

Mildly heterogeneous diffusion weighted imaging throughout the
brain. Suggestion of 1 or 2 punctate foci of restricted diffusion in
the right occipital lobe (series 3, image 21 and series 5, image 5)
appears instead to correspond to artifact from chronic micro
hemorrhages (see series 10, images 10 and 11). There also appears to
be chronic micro hemorrhage in the opposite left occipital pole
(image 10). No definite restricted diffusion elsewhere.

Underlying confluent bilateral cerebral white matter T2 and FLAIR
hyperintensity, mostly periventricular but extending to subcortical
white matter in some areas. T2 hyperintensity in the posterior limb
of the right internal capsule appears chronic. There is also
evidence of a chronic lacunar infarct in the left thalamus and also
at the left caudothalamic groove. No cortical encephalomalacia
identified.

No midline shift, mass effect, evidence of mass lesion,
ventriculomegaly, extra-axial collection or acute intracranial
hemorrhage. Cervicomedullary junction and pituitary are within
normal limits. Negative visualized cervical spine.

Visible internal auditory structures appear normal. Mastoids are
clear. Paranasal sinuses are clear. Orbit and scalp soft tissues
appear within normal limits. Visualized bone marrow signal is within
normal limits.
IMPRESSION: 1.  No acute intracranial abnormality identified.
2. Constellation of signal changes in the brain most compatible with
age advanced chronic small vessel disease, including chronic micro
hemorrhages in both occipital poles.
